# Patient Record
Sex: Male | Born: 1950 | Race: White | Hispanic: No | Marital: Married | Smoking: Never smoker
Health system: Southern US, Community
[De-identification: ages and names within clinical notes are randomized; demographics above are authoritative.]

## PROBLEM LIST (undated history)

## (undated) DIAGNOSIS — Z8719 Personal history of other diseases of the digestive system: Secondary | ICD-10-CM

## (undated) DIAGNOSIS — I493 Ventricular premature depolarization: Secondary | ICD-10-CM

## (undated) DIAGNOSIS — I10 Essential (primary) hypertension: Secondary | ICD-10-CM

## (undated) DIAGNOSIS — I609 Nontraumatic subarachnoid hemorrhage, unspecified: Secondary | ICD-10-CM

## (undated) DIAGNOSIS — E669 Obesity, unspecified: Secondary | ICD-10-CM

## (undated) DIAGNOSIS — G4730 Sleep apnea, unspecified: Secondary | ICD-10-CM

## (undated) DIAGNOSIS — J189 Pneumonia, unspecified organism: Secondary | ICD-10-CM

## (undated) DIAGNOSIS — E785 Hyperlipidemia, unspecified: Secondary | ICD-10-CM

## (undated) HISTORY — PX: KNEE ARTHROSCOPY: SUR90

## (undated) HISTORY — PX: COLONOSCOPY: SHX174

## (undated) HISTORY — PX: TONSILLECTOMY: SUR1361

## (undated) HISTORY — PX: VASECTOMY: SHX75

## (undated) HISTORY — PX: UPPER GASTROINTESTINAL ENDOSCOPY: SHX188

---

## 2007-07-27 ENCOUNTER — Ambulatory Visit: Payer: Self-pay | Admitting: Cardiology

## 2007-08-02 ENCOUNTER — Encounter: Payer: Self-pay | Admitting: Cardiology

## 2007-08-02 ENCOUNTER — Ambulatory Visit: Payer: Self-pay

## 2011-03-22 NOTE — Assessment & Plan Note (Signed)
[facility]                            CARDIOLOGY OFFICE NOTE   Brad Velez, Brad Velez                        MRN:          [id number]  DATE:07/27/2007                            DOB:          [birth year]    PRIMARY CARE PHYSICIAN:  None.   REASON FOR VISIT:  Evaluate patient with nonsustained ventricular  tachycardia.   HISTORY OF PRESENT ILLNESS:  The patient is a pleasant 59 year old  gentleman who is moving from [place].  He noted when taking his pulse  this summer that he would have some skipped beats.  He presented to  his primary care doctor who applied a Holter monitor.  He had  ventricular ectopy about 10%.  He had one four-beat run of nonsustained  ventricular tachycardia.  He had multiple couplets.  He does not feel  these.  He has never had any presyncope or syncope.  He was referred to  a cardiologist.  The plan was for a stress perfusion study, but he moved  here before that could be done.  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  He does do some walk/running in the field with  his kids.  With that level of activity and with his usual daily  activities, he denies any chest discomfort, neck or arm discomfort.  He  does not have any shortness of breath.  He does not have any PND or  orthopnea.  He says he does fatigue easier than he used to.  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  He will occasionally  now get on a treadmill, or an elliptical.   PAST MEDICAL HISTORY:  Hypertension x4 years, hyperlipidemia x4 years,  sleep apnea (he could not tolerate CPAP).   PAST SURGICAL HISTORY:  Right total knee replacement, tonsillectomy.   ALLERGIES:  No known drug allergies.   MEDICATIONS:  1. Aspirin 81 mg daily.  2. Lisinopril 10 mg daily.  3. Hydrochlorothiazide 25 mg daily.  4. Simvastatin 20 mg q.h.s.   SOCIAL HISTORY:  The patient is a Scientist, research (physical sciences).  He is married.  He has  four grown  children and three grandchildren.  He has never really smoked  cigarettes, so he chews tobacco.  He drinks rarely.   FAMILY HISTORY:  Noncontributory for early coronary artery disease.  He  does have a sister with some kind of a valve replacement.  His father  died at 74 of cancer.  His mother died at 42 of emphysema.   REVIEW OF SYSTEMS:  As stated in the HPI and negative for other systems.   PHYSICAL EXAMINATION:  GENERAL:  The patient is in no distress.  VITAL SIGNS:  Weight 289 pounds, blood pressure 138/86.  HEENT:  Eyes unremarkable.  Pupils equal, round, and reactive to light.  Fundi within normal limits.  Oral mucosa unremarkable.  NECK:  No jugular venous distention to 45 degrees, carotid upstroke  brisk and symmetric, no bruits and no thyromegaly.  LYMPHATICS:  No cervical, axillary, or inguinal adenopathy.  LUNGS:  Clear to auscultation bilaterally.  BACK:  No costovertebral angle tenderness.  CHEST:  Unremarkable.  HEART:  PMI not displaced or sustained.  S1 and S2 within normal limits.  No S3, no S4, no clicks, no murmurs, no rubs.  ABDOMEN:  Obese, positive bowel sounds normal in frequency and pitch.  No bruits, no rebound, no guarding, no midline pulsatile mass, no  hepatomegaly, and no splenomegaly.  SKIN:  No rashes and no nodules.  EXTREMITIES:  2+ pulses, no cyanosis, clubbing, or edema.  NEUROLOGY:  Oriented to person, place, and time.  Cranial nerves II-XII  grossly intact.  Motor grossly intact.   EKG; sinus bradycardia, rate 57, left axis deviation, poor anterior R  wave progression, intervals within normal limits.  No acute ST T wave  changes.   ASSESSMENT:  1. Nonsustained ventricular tachycardia.  The patient does not have      any symptoms related to this.  At this point, I am going to need to      make sure he has a structurally normal heart.  He will need a      stress perfusion study.  He will also need an echocardiogram.  I am      going to check  with his previous doctor to see if I can get the      full disclosure on his Holter.  I am also going to check to see if      he has labs.  If note, I am going to draw a TSH, BMET, and      magnesium.  2. Obesity.  We talked about the need to lose weight and I prescribed      the [facility] Diet.  He will be cleared for exercise after this      workup.  3. Hypertension.  His blood pressure is well controlled and he will      continue on the medications as listed.  4. Dyslipidemia.  He is going to look for a new primary care doctor.      I will defer management for now.  5. Sleep apnea.  I have encouraged him to see one of our sleep doctors      and we will give him a name.  He wants to hold off on making any      kind of a contact until his insurance situation is straightened      out.   FOLLOWUP:  I will see him back in about three months or sooner as  needed.     Rollene Rotunda, MD, [facility]  Electronically Signed    JH/MedQ  DD: 07/27/2007  DT: 07/27/2007  Job #: [id number]

## 2012-04-17 ENCOUNTER — Other Ambulatory Visit: Payer: Self-pay | Admitting: Orthopedic Surgery

## 2012-04-18 ENCOUNTER — Encounter (HOSPITAL_COMMUNITY): Payer: Self-pay | Admitting: Respiratory Therapy

## 2012-04-18 ENCOUNTER — Encounter (HOSPITAL_COMMUNITY): Payer: Self-pay | Admitting: *Deleted

## 2012-04-18 NOTE — Consult Note (Signed)
Anesthesia Chart Review:  Patient is a 61 year old male scheduled for a right TKR by Dr. Sherlean Foot on 04/30/12.  His PAT lab appointment is not until 04/23/12.   History includes HTN (but no longer requiring medication), HLD, PNA, hiatal hernia, arthritis, OSA, non-smoker.  PCP is Dr. Derrell Lolling with [facility] who cleared him for this procedure.  His EKG there on 03/20/12 showed SR with occasional PVCs, LAD, incomplete right BBB, poor R wave progression, RVH.  He moved from [place] to [place] around 2008.  Just prior to his move, he had a Holter test due to an irregular pulse (that he noticed when checking his pulse prior to exercising).  He was seen by Cardiologist Dr. Antoine Poche on 07/27/07.  By notes, he was told that he had ventricular ectopy ~ 10% and one four beat run of VT.  Patient was asymptomatic.  Stress and echo were recommended.  The patient reports he remembers having a treadmill stress test, but [facility] did not see any stress test results available.  He did have an echo done on 08/02/07 that showed: - Overall left ventricular systolic function was normal. Left ventricular ejection fraction was estimated to be 60 %. There was no diagnostic evidence of left ventricular regional wall motion abnormalities. - The left atrium was moderately dilated. - The right ventricle was mildly dilated. Right ventricular systolic function was mildly reduced. - The right atrium was moderately dilated. - Trivial MR/TR, mild PR.  Since then he has only been followed by his PCP and reports periodic EKGs that Dr. Derrell Lolling has not felt warranted additional Cardiology evaluation. He denies CP, SOB, palpitations, syncope.  He has not noticed any frequent or persistent irregularities in his heart rate.  He was using his elliptical for 45 minutes/day on a regular basis until a few months ago when his knee pain worsened.    Reviewed cardiac history with Anesthesiologist Dr. Michelle Piper who agrees that if there is no significant  change in his status and PAT "lab" results are reasonable, then plan to proceed.    Shonna Chock, PA-C

## 2012-04-18 NOTE — Progress Notes (Signed)
Spoke with Shonna Chock PA about patient hx of "irregular heart rate" and he went to [facility] cardiology. ECHO in EPIC and office note. Patient stated he did not go back to cardiology office. MD note mentions seeing patient back in 3 months but patient was never seen again. Hx HTN but has not been on any meds in 5 yrs per patient.   Called Dr. Derrell Lolling office in [place] and requested EKG-patient states he has had one done in last year.   Revonda Standard is aware of when patient is coming in for lab appt. Will put note on chart to see if Revonda Standard needs to see patient that day or not.

## 2012-04-23 ENCOUNTER — Encounter (HOSPITAL_COMMUNITY)
Admission: RE | Admit: 2012-04-23 | Discharge: 2012-04-23 | Disposition: A | Discharge status: Home / Self Care | Payer: BC Managed Care – PPO | Source: Ambulatory Visit | Attending: Orthopedic Surgery | Admitting: Orthopedic Surgery

## 2012-04-23 LAB — COMPREHENSIVE METABOLIC PANEL
ALT: 34 U/L (ref 0–53)
Albumin: 3.7 g/dL (ref 3.5–5.2)
Alkaline Phosphatase: 44 U/L (ref 39–117)
BUN: 18 mg/dL (ref 6–23)
Chloride: 103 mEq/L (ref 96–112)
Glucose, Bld: 91 mg/dL (ref 70–99)
Potassium: 4.6 mEq/L (ref 3.5–5.1)
Sodium: 139 mEq/L (ref 135–145)
Total Bilirubin: 0.4 mg/dL (ref 0.3–1.2)
Total Protein: 7.4 g/dL (ref 6.0–8.3)

## 2012-04-23 LAB — URINALYSIS, ROUTINE W REFLEX MICROSCOPIC
Bilirubin Urine: NEGATIVE
Glucose, UA: NEGATIVE mg/dL
Hgb urine dipstick: NEGATIVE
Ketones, ur: NEGATIVE mg/dL
Specific Gravity, Urine: 1.025 (ref 1.005–1.030)
pH: 5 (ref 5.0–8.0)

## 2012-04-23 LAB — TYPE AND SCREEN: Antibody Screen: NEGATIVE

## 2012-04-23 LAB — DIFFERENTIAL
Basophils Relative: 1 % (ref 0–1)
Eosinophils Absolute: 0.3 10*3/uL (ref 0.0–0.7)
Eosinophils Relative: 7 % — ABNORMAL HIGH (ref 0–5)
Lymphs Abs: 1.4 10*3/uL (ref 0.7–4.0)
Monocytes Relative: 9 % (ref 3–12)
Neutrophils Relative %: 55 % (ref 43–77)

## 2012-04-23 LAB — CBC
HCT: 48.9 % (ref 39.0–52.0)
Hemoglobin: 16.4 g/dL (ref 13.0–17.0)
MCHC: 33.5 g/dL (ref 30.0–36.0)
RDW: 13.3 % (ref 11.5–15.5)
WBC: 4.9 10*3/uL (ref 4.0–10.5)

## 2012-04-23 LAB — PROTIME-INR: INR: 0.95 (ref 0.00–1.49)

## 2012-04-23 LAB — SURGICAL PCR SCREEN: MRSA, PCR: NEGATIVE

## 2012-04-23 LAB — APTT: aPTT: 35 seconds (ref 24–37)

## 2012-04-23 NOTE — Pre-Procedure Instructions (Signed)
20 Brad Velez  04/23/2012   Your procedure is scheduled on:  Mon, June 24 @ 7:30 AM  Report to [facility] at 5:30 AM.  Call this number if you have problems the morning of surgery: [phone]   Remember:   Do not eat food:After Midnight.    Take these medicines the morning of surgery with A SIP OF WATER:    Do not wear jewelry  Do not wear lotions, powders, or cologne  Men may shave face and neck.  Do not bring valuables to the hospital.  Contacts, dentures or bridgework may not be worn into surgery.  Leave suitcase in the car. After surgery it may be brought to your room.  For patients admitted to the hospital, checkout time is 11:00 AM the day of discharge.   Patients discharged the day of surgery will not be allowed to drive home.  Special Instructions: Incentive Spirometry - Practice and bring it with you on the day of surgery. and CHG Shower Use Special Wash: 1/2 bottle night before surgery and 1/2 bottle morning of surgery.   Please read over the following fact sheets that you were given: Pain Booklet, Coughing and Deep Breathing, Blood Transfusion Information, Total Joint Packet, MRSA Information and Surgical Site Infection Prevention

## 2012-04-24 LAB — URINE CULTURE
Colony Count: NO GROWTH
Culture  Setup Time: 201306170817
Culture: NO GROWTH

## 2012-04-29 MED ORDER — CEFAZOLIN SODIUM-DEXTROSE 2-3 GM-% IV SOLR
2.0000 g | INTRAVENOUS | Status: AC
  Administered 2012-04-30: 2 g via INTRAVENOUS
  Filled 2012-04-29: qty 50

## 2012-04-29 MED ORDER — CHLORHEXIDINE GLUCONATE 4 % EX LIQD: 60.0000 mL | Freq: Once | CUTANEOUS | Status: DC

## 2012-04-29 NOTE — H&P (Signed)
  Brad Velez MRN:  [id number] DOB/SEX:  03-30-1951/male  CHIEF COMPLAINT:  Painful right Knee  HISTORY: Patient is a 61 y.o. male presented with a history of pain in the right knee. Onset of symptoms was gradual starting several years ago with gradually worsening course since that time. The patient noted no past surgery on the right knee. Prior procedures on the knee include arthroscopy. Patient has been treated conservatively with over-the-counter NSAIDs and activity modification. Patient currently rates pain in the knee at 9 out of 10 with activity. There is pain at night.  PAST MEDICAL HISTORY: There are no active problems to display for this patient.  Past Medical History  Diagnosis Date  . Hypertension     hx of but off of medication for 5 yrs  . Pneumonia     1- 2 yrs ago  . Sleep apnea     doesnt use CPAP- tested 15 yrs ago  . H/O hiatal hernia   . Arthritis    Past Surgical History  Procedure Date  . Knee arthroscopy     right  . Tonsillectomy      MEDICATIONS:   No prescriptions prior to admission    ALLERGIES:  No Known Allergies  REVIEW OF SYSTEMS:  Pertinent items are noted in HPI.   FAMILY HISTORY:  No family history on file.  SOCIAL HISTORY:   History  Substance Use Topics  . Smoking status: Never Smoker   . Smokeless tobacco: Former Neurosurgeon    Quit date: 11/18/2008  . Alcohol Use: Yes     occasional     EXAMINATION:  Vital signs in last 24 hours:    General appearance: alert, cooperative and no distress Lungs: clear to auscultation bilaterally Heart: regular rate and rhythm, S1, S2 normal, no murmur, click, rub or gallop Abdomen: soft, non-tender; bowel sounds normal; no masses,  no organomegaly Extremities: extremities normal, atraumatic, no cyanosis or edema and Homans sign is negative, no sign of DVT Pulses: 2+ and symmetric Skin: Skin color, texture, turgor normal. No rashes or lesions Neurologic: Alert and oriented X 3, normal  strength and tone. Normal symmetric reflexes. Normal coordination and gait  Musculoskeletal:  ROM 0-120, Ligaments intact,  Imaging Review Plain radiographs demonstrate severe degenerative joint disease of the right knee. The overall alignment is mild varus. The bone quality appears to be good for age and reported activity level.  Assessment/Plan: End stage arthritis, right knee   The patient history, physical examination and imaging studies are consistent with advanced degenerative joint disease of the right knee. The patient has failed conservative treatment.  The clearance notes were reviewed.  After discussion with the patient it was felt that Total Knee Replacement was indicated. The procedure,  risks, and benefits of total knee arthroplasty were presented and reviewed. The risks including but not limited to aseptic loosening, infection, blood clots, vascular injury, stiffness, patella tracking problems complications among others were discussed. The patient acknowledged the explanation, agreed to proceed with the plan.  Brad Velez 04/29/2012, 6:54 PM

## 2012-04-30 ENCOUNTER — Encounter (HOSPITAL_COMMUNITY)
Admission: RE | Disposition: A | Discharge status: Home Health | Payer: Self-pay | Source: Ambulatory Visit | Attending: Orthopedic Surgery

## 2012-04-30 ENCOUNTER — Encounter (HOSPITAL_COMMUNITY): Payer: Self-pay | Admitting: Vascular Surgery

## 2012-04-30 ENCOUNTER — Encounter (HOSPITAL_COMMUNITY): Payer: Self-pay | Admitting: *Deleted

## 2012-04-30 ENCOUNTER — Ambulatory Visit (HOSPITAL_COMMUNITY): Payer: BC Managed Care – PPO | Admitting: Vascular Surgery

## 2012-04-30 ENCOUNTER — Inpatient Hospital Stay (HOSPITAL_COMMUNITY)
Admission: RE | Admit: 2012-04-30 | Discharge: 2012-05-02 | DRG: 209 | Disposition: A | Discharge status: Home Health | Payer: BC Managed Care – PPO | Source: Ambulatory Visit | Attending: Orthopedic Surgery | Admitting: Orthopedic Surgery

## 2012-04-30 DIAGNOSIS — D62 Acute posthemorrhagic anemia: Secondary | ICD-10-CM | POA: Diagnosis not present

## 2012-04-30 DIAGNOSIS — M1711 Unilateral primary osteoarthritis, right knee: Secondary | ICD-10-CM

## 2012-04-30 DIAGNOSIS — G4730 Sleep apnea, unspecified: Secondary | ICD-10-CM | POA: Diagnosis present

## 2012-04-30 DIAGNOSIS — M1710 Unilateral primary osteoarthritis, unspecified knee: Principal | ICD-10-CM | POA: Diagnosis present

## 2012-04-30 DIAGNOSIS — Z7901 Long term (current) use of anticoagulants: Secondary | ICD-10-CM

## 2012-04-30 DIAGNOSIS — Z79899 Other long term (current) drug therapy: Secondary | ICD-10-CM

## 2012-04-30 DIAGNOSIS — I10 Essential (primary) hypertension: Secondary | ICD-10-CM | POA: Diagnosis present

## 2012-04-30 DIAGNOSIS — Z87891 Personal history of nicotine dependence: Secondary | ICD-10-CM

## 2012-04-30 HISTORY — DX: Sleep apnea, unspecified: G47.30

## 2012-04-30 HISTORY — DX: Personal history of other diseases of the digestive system: Z87.19

## 2012-04-30 HISTORY — DX: Pneumonia, unspecified organism: J18.9

## 2012-04-30 HISTORY — DX: Essential (primary) hypertension: I10

## 2012-04-30 HISTORY — PX: TOTAL KNEE ARTHROPLASTY: SHX125

## 2012-04-30 SURGERY — ARTHROPLASTY, KNEE, TOTAL
Anesthesia: Regional | Site: Knee | Laterality: Right | Wound class: Clean

## 2012-04-30 MED ORDER — MORPHINE SULFATE 10 MG/ML IJ SOLN
INTRAMUSCULAR | Status: DC | PRN
  Administered 2012-04-30: 6 mg via INTRAVENOUS
  Administered 2012-04-30: 4 mg via INTRAVENOUS

## 2012-04-30 MED ORDER — ONDANSETRON HCL 4 MG PO TABS: 4.0000 mg | ORAL_TABLET | Freq: Four times a day (QID) | ORAL | Status: DC | PRN

## 2012-04-30 MED ORDER — SODIUM CHLORIDE 0.9 % IV SOLN
INTRAVENOUS | Status: DC
  Administered 2012-04-30 (×2): via INTRAVENOUS

## 2012-04-30 MED ORDER — FENTANYL CITRATE 0.05 MG/ML IJ SOLN
INTRAMUSCULAR | Status: DC | PRN
  Administered 2012-04-30: 50 ug via INTRAVENOUS
  Administered 2012-04-30 (×4): 100 ug via INTRAVENOUS
  Administered 2012-04-30: 50 ug via INTRAVENOUS

## 2012-04-30 MED ORDER — ACETAMINOPHEN 650 MG RE SUPP: 650.0000 mg | Freq: Four times a day (QID) | RECTAL | Status: DC | PRN

## 2012-04-30 MED ORDER — MENTHOL 3 MG MT LOZG: 1.0000 | LOZENGE | OROMUCOSAL | Status: DC | PRN

## 2012-04-30 MED ORDER — SODIUM CHLORIDE 0.9 % IV SOLN: INTRAVENOUS | Status: DC

## 2012-04-30 MED ORDER — BUPIVACAINE 0.25 % ON-Q PUMP SINGLE CATH 300ML
INJECTION | Status: DC | PRN
  Administered 2012-04-30: 300 mL

## 2012-04-30 MED ORDER — METOCLOPRAMIDE HCL 5 MG/ML IJ SOLN: 5.0000 mg | Freq: Three times a day (TID) | INTRAMUSCULAR | Status: DC | PRN

## 2012-04-30 MED ORDER — METOCLOPRAMIDE HCL 10 MG PO TABS: 5.0000 mg | ORAL_TABLET | Freq: Three times a day (TID) | ORAL | Status: DC | PRN

## 2012-04-30 MED ORDER — ACETAMINOPHEN 10 MG/ML IV SOLN
1000.0000 mg | Freq: Four times a day (QID) | INTRAVENOUS | Status: AC
  Administered 2012-04-30 – 2012-05-01 (×4): 1000 mg via INTRAVENOUS
  Filled 2012-04-30 (×4): qty 100

## 2012-04-30 MED ORDER — LACTATED RINGERS IV SOLN
INTRAVENOUS | Status: DC | PRN
  Administered 2012-04-30 (×2): via INTRAVENOUS

## 2012-04-30 MED ORDER — MELOXICAM 15 MG PO TABS
15.0000 mg | ORAL_TABLET | Freq: Every day | ORAL | Status: DC
Start: 2012-04-30 — End: 2012-05-02
  Administered 2012-04-30 – 2012-05-02 (×3): 15 mg via ORAL
  Filled 2012-04-30 (×3): qty 1

## 2012-04-30 MED ORDER — ENOXAPARIN SODIUM 30 MG/0.3ML ~~LOC~~ SOLN
30.0000 mg | Freq: Two times a day (BID) | SUBCUTANEOUS | Status: DC
  Administered 2012-04-30 – 2012-05-02 (×4): 30 mg via SUBCUTANEOUS
  Filled 2012-04-30 (×6): qty 0.3

## 2012-04-30 MED ORDER — HYDROMORPHONE HCL PF 1 MG/ML IJ SOLN
0.2500 mg | INTRAMUSCULAR | Status: DC | PRN
  Administered 2012-04-30 (×2): 0.5 mg via INTRAVENOUS

## 2012-04-30 MED ORDER — CEFAZOLIN SODIUM-DEXTROSE 2-3 GM-% IV SOLR
2.0000 g | Freq: Four times a day (QID) | INTRAVENOUS | Status: AC
  Administered 2012-04-30 (×2): 2 g via INTRAVENOUS
  Filled 2012-04-30 (×3): qty 50

## 2012-04-30 MED ORDER — BUPIVACAINE-EPINEPHRINE PF 0.5-1:200000 % IJ SOLN
INTRAMUSCULAR | Status: DC | PRN
  Administered 2012-04-30: 30 mL

## 2012-04-30 MED ORDER — PROPOFOL 10 MG/ML IV EMUL
INTRAVENOUS | Status: DC | PRN
  Administered 2012-04-30: 200 mg via INTRAVENOUS

## 2012-04-30 MED ORDER — OXYCODONE HCL 5 MG PO TABS
5.0000 mg | ORAL_TABLET | ORAL | Status: DC | PRN
  Administered 2012-04-30 – 2012-05-02 (×3): 10 mg via ORAL
  Filled 2012-04-30 (×3): qty 2

## 2012-04-30 MED ORDER — ATORVASTATIN CALCIUM 20 MG PO TABS
20.0000 mg | ORAL_TABLET | Freq: Every day | ORAL | Status: DC
  Administered 2012-04-30: 20 mg via ORAL
  Filled 2012-04-30 (×2): qty 1

## 2012-04-30 MED ORDER — BUPIVACAINE HCL (PF) 0.25 % IJ SOLN
INTRAMUSCULAR | Status: AC
  Filled 2012-04-30: qty 30

## 2012-04-30 MED ORDER — VECURONIUM BROMIDE 10 MG IV SOLR
INTRAVENOUS | Status: DC | PRN
  Administered 2012-04-30: 6 mg via INTRAVENOUS

## 2012-04-30 MED ORDER — ONDANSETRON HCL 4 MG/2ML IJ SOLN
INTRAMUSCULAR | Status: DC | PRN
  Administered 2012-04-30: 4 mg via INTRAVENOUS

## 2012-04-30 MED ORDER — ACETAMINOPHEN 325 MG PO TABS
650.0000 mg | ORAL_TABLET | Freq: Four times a day (QID) | ORAL | Status: DC | PRN
  Administered 2012-05-02: 650 mg via ORAL
  Filled 2012-04-30: qty 2

## 2012-04-30 MED ORDER — OXYCODONE HCL 10 MG PO TB12
10.0000 mg | ORAL_TABLET | Freq: Two times a day (BID) | ORAL | Status: DC
  Administered 2012-04-30 – 2012-05-02 (×4): 10 mg via ORAL
  Filled 2012-04-30 (×4): qty 1

## 2012-04-30 MED ORDER — ONDANSETRON HCL 4 MG/2ML IJ SOLN: 4.0000 mg | Freq: Four times a day (QID) | INTRAMUSCULAR | Status: DC | PRN

## 2012-04-30 MED ORDER — NEOSTIGMINE METHYLSULFATE 1 MG/ML IJ SOLN
INTRAMUSCULAR | Status: DC | PRN
  Administered 2012-04-30: 3 mg via INTRAVENOUS

## 2012-04-30 MED ORDER — HYDROMORPHONE HCL PF 1 MG/ML IJ SOLN
1.0000 mg | INTRAMUSCULAR | Status: DC | PRN
  Administered 2012-04-30 – 2012-05-02 (×10): 1 mg via INTRAVENOUS
  Filled 2012-04-30 (×10): qty 1

## 2012-04-30 MED ORDER — SODIUM CHLORIDE 0.9 % IR SOLN
Status: DC | PRN
  Administered 2012-04-30: 1000 mL
  Administered 2012-04-30: 3000 mL

## 2012-04-30 MED ORDER — ACETAMINOPHEN 10 MG/ML IV SOLN
1000.0000 mg | Freq: Four times a day (QID) | INTRAVENOUS | Status: DC
  Administered 2012-04-30: 1000 mg via INTRAVENOUS

## 2012-04-30 MED ORDER — GLYCOPYRROLATE 0.2 MG/ML IJ SOLN
INTRAMUSCULAR | Status: DC | PRN
  Administered 2012-04-30: .6 mg via INTRAVENOUS

## 2012-04-30 MED ORDER — LIDOCAINE HCL (CARDIAC) 20 MG/ML IV SOLN
INTRAVENOUS | Status: DC | PRN
  Administered 2012-04-30: 75 mg via INTRAVENOUS

## 2012-04-30 MED ORDER — BUPIVACAINE-EPINEPHRINE 0.25% -1:200000 IJ SOLN
INTRAMUSCULAR | Status: DC | PRN
  Administered 2012-04-30: 22 mL

## 2012-04-30 MED ORDER — OXYCODONE HCL 5 MG PO TABS
ORAL_TABLET | ORAL | Status: AC
  Filled 2012-04-30: qty 2

## 2012-04-30 MED ORDER — PHENOL 1.4 % MT LIQD: 1.0000 | OROMUCOSAL | Status: DC | PRN

## 2012-04-30 MED ORDER — ALUM & MAG HYDROXIDE-SIMETH 200-200-20 MG/5ML PO SUSP: 30.0000 mL | ORAL | Status: DC | PRN

## 2012-04-30 MED ORDER — HYDROMORPHONE HCL PF 1 MG/ML IJ SOLN
INTRAMUSCULAR | Status: AC
  Administered 2012-05-01: 1 mg via INTRAVENOUS
  Filled 2012-04-30: qty 1

## 2012-04-30 MED ORDER — BUPIVACAINE 0.25 % ON-Q PUMP SINGLE CATH 300ML
300.0000 mL | INJECTION | Status: DC
  Filled 2012-04-30: qty 300

## 2012-04-30 SURGICAL SUPPLY — 56 items
BANDAGE ESMARK 6X9 LF (GAUZE/BANDAGES/DRESSINGS) ×1 IMPLANT
BLADE SAGITTAL 13X1.27X60 (BLADE) ×2 IMPLANT
BLADE SAW SGTL 83.5X18.5 (BLADE) ×2 IMPLANT
BNDG ESMARK 6X9 LF (GAUZE/BANDAGES/DRESSINGS) ×2
BOWL SMART MIX CTS (DISPOSABLE) ×2 IMPLANT
CATH KIT ON Q 5IN SLV (PAIN MANAGEMENT) ×2 IMPLANT
CEMENT BONE SIMPLEX SPEEDSET (Cement) ×4 IMPLANT
CLOTH BEACON ORANGE TIMEOUT ST (SAFETY) ×2 IMPLANT
COVER BACK TABLE 24X17X13 BIG (DRAPES) IMPLANT
COVER SURGICAL LIGHT HANDLE (MISCELLANEOUS) ×2 IMPLANT
CUFF TOURNIQUET SINGLE 34IN LL (TOURNIQUET CUFF) ×2 IMPLANT
DRAPE EXTREMITY T 121X128X90 (DRAPE) ×2 IMPLANT
DRAPE INCISE IOBAN 66X45 STRL (DRAPES) ×4 IMPLANT
DRAPE PROXIMA HALF (DRAPES) ×2 IMPLANT
DRAPE U-SHAPE 47X51 STRL (DRAPES) ×2 IMPLANT
DRSG ADAPTIC 3X8 NADH LF (GAUZE/BANDAGES/DRESSINGS) ×2 IMPLANT
DRSG PAD ABDOMINAL 8X10 ST (GAUZE/BANDAGES/DRESSINGS) ×2 IMPLANT
DURAPREP 26ML APPLICATOR (WOUND CARE) ×4 IMPLANT
ELECT REM PT RETURN 9FT ADLT (ELECTROSURGICAL) ×2
ELECTRODE REM PT RTRN 9FT ADLT (ELECTROSURGICAL) ×1 IMPLANT
EVACUATOR 1/8 PVC DRAIN (DRAIN) ×2 IMPLANT
GLOVE BIOGEL M 7.0 STRL (GLOVE) IMPLANT
GLOVE BIOGEL PI IND STRL 7.5 (GLOVE) IMPLANT
GLOVE BIOGEL PI IND STRL 8.5 (GLOVE) ×2 IMPLANT
GLOVE BIOGEL PI INDICATOR 7.5 (GLOVE)
GLOVE BIOGEL PI INDICATOR 8.5 (GLOVE) ×2
GLOVE SURG ORTHO 8.0 STRL STRW (GLOVE) ×4 IMPLANT
GOWN PREVENTION PLUS XLARGE (GOWN DISPOSABLE) ×4 IMPLANT
GOWN STRL NON-REIN LRG LVL3 (GOWN DISPOSABLE) ×2 IMPLANT
HANDPIECE INTERPULSE COAX TIP (DISPOSABLE) ×1
HOOD PEEL AWAY FACE SHEILD DIS (HOOD) ×8 IMPLANT
KIT BASIN OR (CUSTOM PROCEDURE TRAY) ×2 IMPLANT
KIT ROOM TURNOVER OR (KITS) ×2 IMPLANT
MANIFOLD NEPTUNE II (INSTRUMENTS) ×2 IMPLANT
NEEDLE 22X1 1/2 (OR ONLY) (NEEDLE) ×2 IMPLANT
NS IRRIG 1000ML POUR BTL (IV SOLUTION) ×2 IMPLANT
PACK TOTAL JOINT (CUSTOM PROCEDURE TRAY) ×2 IMPLANT
PAD ARMBOARD 7.5X6 YLW CONV (MISCELLANEOUS) ×4 IMPLANT
PADDING CAST COTTON 6X4 STRL (CAST SUPPLIES) ×2 IMPLANT
POSITIONER HEAD PRONE TRACH (MISCELLANEOUS) ×2 IMPLANT
SET HNDPC FAN SPRY TIP SCT (DISPOSABLE) ×1 IMPLANT
SPONGE GAUZE 4X4 12PLY (GAUZE/BANDAGES/DRESSINGS) ×2 IMPLANT
STAPLER VISISTAT 35W (STAPLE) ×2 IMPLANT
SUCTION FRAZIER TIP 10 FR DISP (SUCTIONS) ×2 IMPLANT
SUT BONE WAX W31G (SUTURE) ×2 IMPLANT
SUT VIC AB 0 CTB1 27 (SUTURE) ×4 IMPLANT
SUT VIC AB 1 CT1 27 (SUTURE) ×1
SUT VIC AB 1 CT1 27XBRD ANBCTR (SUTURE) ×1 IMPLANT
SUT VIC AB 2-0 CT1 27 (SUTURE) ×2
SUT VIC AB 2-0 CT1 TAPERPNT 27 (SUTURE) ×2 IMPLANT
SUT VLOC 180 0 24IN GS25 (SUTURE) ×2 IMPLANT
SYR CONTROL 10ML LL (SYRINGE) ×2 IMPLANT
TOWEL OR 17X24 6PK STRL BLUE (TOWEL DISPOSABLE) ×2 IMPLANT
TOWEL OR 17X26 10 PK STRL BLUE (TOWEL DISPOSABLE) ×2 IMPLANT
TRAY FOLEY CATH 14FR (SET/KITS/TRAYS/PACK) ×2 IMPLANT
WATER STERILE IRR 1000ML POUR (IV SOLUTION) ×6 IMPLANT

## 2012-04-30 NOTE — Addendum Note (Signed)
Addendum  created 04/30/12 1143 by Carmela Rima, CRNA   Modules edited:Anesthesia Blocks and Procedures, Inpatient Notes

## 2012-04-30 NOTE — Anesthesia Procedure Notes (Addendum)
Anesthesia Regional Block:  Femoral nerve block  Pre-Anesthetic Checklist: ,, timeout performed, Correct Patient, Correct Site, Correct Laterality, Correct Procedure,, site marked, risks and benefits discussed, Surgical consent,  Pre-op evaluation,  At surgeon's request and post-op pain management  Laterality: Right  Prep: chloraprep       Needles:  Injection technique: Single-shot  Needle Type: Echogenic Stimulator Needle     Needle Length: 9cm  Needle Gauge: 21    Additional Needles:  Procedures: nerve stimulator Femoral nerve block  Nerve Stimulator or Paresthesia:  Response: Quadriceps muscle contraction, 0.45 mA,   Additional Responses:   Narrative:  Start time: 04/30/2012 7:01 AM End time: 04/30/2012 7:16 AM Injection made incrementally with aspirations every 5 mL.  Performed by: Personally  Anesthesiologist: Dr Chaney Malling  Additional Notes: Functioning IV was confirmed and monitors were applied.  A 90mm 21ga Arrow echogenic stimulator needle was used. Sterile prep and drape,hand hygiene and sterile gloves were used.  Negative aspiration and negative test dose prior to incremental administration of local anesthetic. The patient tolerated the procedure well.    Femoral nerve block Procedure Name: Intubation Date/Time: 04/30/2012 7:43 AM Performed by: Carmela Rima Pre-anesthesia Checklist: Patient identified, Timeout performed, Emergency Drugs available, Suction available and Patient being monitored Patient Re-evaluated:Patient Re-evaluated prior to inductionOxygen Delivery Method: Circle system utilized Preoxygenation: Pre-oxygenation with 100% oxygen Intubation Type: IV induction Ventilation: Mask ventilation without difficulty Laryngoscope Size: Mac and 3 Grade View: Grade I Tube type: Oral Tube size: 7.5 mm Number of attempts: 1 Placement Confirmation: ETT inserted through vocal cords under direct vision,  positive ETCO2 and breath sounds checked- equal  and bilateral Secured at: 23 cm Tube secured with: Tape Dental Injury: Teeth and Oropharynx as per pre-operative assessment

## 2012-04-30 NOTE — Anesthesia Postprocedure Evaluation (Signed)
Anesthesia Post Note  Patient: Brad Velez  Procedure(s) Performed: Procedure(s) (LRB): TOTAL KNEE ARTHROPLASTY (Right)  Anesthesia type: General  Patient location: PACU  Post pain: Pain level controlled and Adequate analgesia  Post assessment: Post-op Vital signs reviewed, Patient's Cardiovascular Status Stable, Respiratory Function Stable, Patent Airway and Pain level controlled  Last Vitals:  Filed Vitals:   04/30/12 1009  BP: 175/84  Pulse: 78  Temp: 36.7 C  Resp: 15    Post vital signs: Reviewed and stable  Level of consciousness: awake, alert  and oriented  Complications: No apparent anesthesia complications

## 2012-04-30 NOTE — Progress Notes (Signed)
Orthopedic Tech Progress Note Patient Details:  Brad Velez Mar 05, 1951 [id number]  CPM Right Knee CPM Right Knee: On Right Knee Flexion (Degrees): 90  Right Knee Extension (Degrees): 0  Additional Comments: trapeze bar   Cammer, Mickie Bail 04/30/2012, 10:41 AM

## 2012-04-30 NOTE — Transfer of Care (Signed)
Immediate Anesthesia Transfer of Care Note  Patient: Brad Velez  Procedure(s) Performed: Procedure(s) (LRB): TOTAL KNEE ARTHROPLASTY (Right)  Patient Location: PACU  Anesthesia Type: General  Level of Consciousness: awake, alert  and oriented  Airway & Oxygen Therapy: Patient Spontanous Breathing and Patient connected to nasal cannula oxygen  Post-op Assessment: Report given to PACU RN, Post -op Vital signs reviewed and stable, Patient moving all extremities and Patient able to stick tongue midline  Post vital signs: Reviewed and stable  Complications: No apparent anesthesia complications

## 2012-04-30 NOTE — Anesthesia Preprocedure Evaluation (Addendum)
Anesthesia Evaluation  Patient identified by MRN, date of birth, ID band Patient awake    Reviewed: Allergy & Precautions, H&P , NPO status , Patient's Chart, lab work & pertinent test results  History of Anesthesia Complications (+) PROLONGED EMERGENCE  Airway Mallampati: II  Neck ROM: full    Dental  (+) Dental Advidsory Given   Pulmonary sleep apnea , pneumonia ,          Cardiovascular hypertension,     Neuro/Psych    GI/Hepatic hiatal hernia,   Endo/Other  obese  Renal/GU      Musculoskeletal  (+) Arthritis -,   Abdominal   Peds  Hematology   Anesthesia Other Findings   Reproductive/Obstetrics                          Anesthesia Physical Anesthesia Plan  ASA: II  Anesthesia Plan: General and Regional   Post-op Pain Management: MAC Combined w/ Regional for Post-op pain   Induction: Intravenous  Airway Management Planned: Oral ETT  Additional Equipment:   Intra-op Plan:   Post-operative Plan: Extubation in OR  Informed Consent: I have reviewed the patients History and Physical, chart, labs and discussed the procedure including the risks, benefits and alternatives for the proposed anesthesia with the patient or authorized representative who has indicated his/her understanding and acceptance.   Dental Advisory Given  Plan Discussed with: CRNA and Surgeon  Anesthesia Plan Comments:        Anesthesia Quick Evaluation

## 2012-04-30 NOTE — Preoperative (Signed)
Beta Blockers   Reason not to administer Beta Blockers:Not Applicable 

## 2012-04-30 NOTE — Addendum Note (Signed)
Addendum  created 04/30/12 1143 by Chosen Geske F Johnette Teigen, CRNA   Modules edited:Anesthesia Blocks and Procedures, Inpatient Notes    

## 2012-05-01 LAB — CBC
MCHC: 33.4 g/dL (ref 30.0–36.0)
MCV: 97.2 fL (ref 78.0–100.0)
Platelets: 207 10*3/uL (ref 150–400)
RDW: 13.5 % (ref 11.5–15.5)
WBC: 9.5 10*3/uL (ref 4.0–10.5)

## 2012-05-01 LAB — BASIC METABOLIC PANEL
Chloride: 96 mEq/L (ref 96–112)
Creatinine, Ser: 1.05 mg/dL (ref 0.50–1.35)
GFR calc Af Amer: 85 mL/min — ABNORMAL LOW (ref >90)
GFR calc non Af Amer: 74 mL/min — ABNORMAL LOW (ref >90)
Potassium: 4.7 mEq/L (ref 3.5–5.1)

## 2012-05-01 MED ORDER — ROSUVASTATIN CALCIUM 10 MG PO TABS
10.0000 mg | ORAL_TABLET | Freq: Every day | ORAL | Status: DC
  Administered 2012-05-01: 10 mg via ORAL
  Filled 2012-05-01 (×2): qty 1

## 2012-05-01 MED FILL — Acetaminophen IV Soln 10 MG/ML: INTRAVENOUS | Qty: 100 | Status: AC

## 2012-05-01 NOTE — Progress Notes (Signed)
PT Progress Note:     05/01/12 1300  PT Visit Information  Last PT Received On 05/01/12  Assistance Needed +1  PT Time Calculation  PT Start Time 1311  PT Stop Time 1346  PT Time Calculation (min) 35 min  Precautions  Precautions Knee  Restrictions  RLE Weight Bearing WBAT  Cognition  Overall Cognitive Status Appears within functional limits for tasks assessed/performed  Arousal/Alertness Awake/alert  Orientation Level Oriented X4 / Intact  Behavior During Session [facility] for tasks performed  Bed Mobility  Bed Mobility Supine to Sit;Sit to Supine  Supine to Sit 4: Min guard;HOB flat  Sit to Supine 4: Min guard;HOB flat  Details for Bed Mobility Assistance Cues for technique.  No physical (A) needed.    Transfers  Transfers Sit to Stand;Stand to Sit  Sit to Stand 4: Min guard;With upper extremity assist;From bed  Stand to Sit 4: Min guard;With upper extremity assist;To bed  Details for Transfer Assistance Reinforcement cues for safest hand placement  Ambulation/Gait  Ambulation/Gait Assistance 4: Min guard  Ambulation Distance (Feet) 100 Feet  Assistive device Rolling walker  Ambulation/Gait Assistance Details cues to roll RW rather than pick up to advance, cues for  tall posture, increased knee extension during stance phase.    Gait Pattern Step-to pattern;Decreased step length - right;Decreased step length - left;Decreased stance time - right  Stairs No  Wheelchair Mobility  Wheelchair Mobility No  Balance  Balance Assessed No  Exercises  Exercises Total Joint  Total Joint Exercises  Ankle Circles/Pumps AROM;Both;10 reps;Supine  Quad Sets Right;10 reps;Supine;AROM  Hip ABduction/ADduction AROM;Right;10 reps  Straight Leg Raises AAROM;Right;10 reps  PT - End of Session  Equipment Utilized During Treatment Gait belt  Activity Tolerance Patient tolerated treatment well  Patient left in bed;in CPM;with call bell/phone within reach  PT - Assessment/Plan  Comments on  Treatment Session Pt making progress with mobility this session.  Increased ambulation distance.  Very motivated.  On track for a safe d/c home when MD feels appropriate.  Will trial step to mimic entering/exiting house next PT session.  Pt placed back on CPM at end of session with increased range 0-90 degrees.  Tolerated increase well.    PT Plan Discharge plan remains appropriate  PT Frequency 7X/week  Follow Up Recommendations Home health PT;Supervision/Assistance - 24 hour  Equipment Recommended None recommended by OT  Acute Rehab PT Goals  Time For Goal Achievement 05/08/12  Potential to Achieve Goals Good  PT Goal: Supine/Side to Sit - Progress Progressing toward goal  PT Goal: Sit to Supine/Side - Progress Progressing toward goal  PT Goal: Sit to Stand - Progress Progressing toward goal  PT Goal: Ambulate - Progress Progressing toward goal  PT Goal: Perform Home Exercise Program - Progress Progressing toward goal  PT General Charges  $$ ACUTE PT VISIT 1 Procedure  PT Treatments  $Gait Training 8-22 mins  $Therapeutic Activity 8-22 mins     Pain:  "it's fine".  Pt did not rate.     Verdell Face, [place] [phone] 05/01/2012

## 2012-05-01 NOTE — Op Note (Signed)
TOTAL KNEE REPLACEMENT OPERATIVE NOTE:  04/30/2012  6:16 PM  PATIENT:  Brad Velez  61 y.o. male  PRE-OPERATIVE DIAGNOSIS:  osteoarthritis right knee  POST-OPERATIVE DIAGNOSIS:  osteoarthritis right knee  PROCEDURE:  Procedure(s): TOTAL KNEE ARTHROPLASTY  SURGEON:  Surgeon(s): Raymon Mutton, MD  PHYSICIAN ASSISTANT: Altamese Cabal, [facility]  ANESTHESIA:   general  DRAINS: Hemovac and On-Q Marcaine Pain Pump  SPECIMEN: None  COUNTS:  Correct  TOURNIQUET:   Total Tourniquet Time Documented: Thigh (Right) - 69 minutes  DICTATION:  Indication for procedure:    The patient is a 61 y.o. male who has failed conservative treatment for osteoarthritis right knee.  Informed consent was obtained prior to anesthesia. The risks versus benefits of the operation were explain and in a way the patient can, and did, understand.   Description of procedure:     The patient was taken to the operating room and placed under anesthesia.  The patient was positioned in the usual fashion taking care that all body parts were adequately padded and/or protected.  I foley catheter was placed.  A tourniquet was applied and the leg prepped and draped in the usual sterile fashion.  The extremity was exsanguinated with the esmarch and tourniquet inflated to 350 mmHg.  Pre-operative range of motion was normal.  The knee was in 5 degree of mild varus.  A midline incision approximately 6-7 inches long was made with a #10 blade.  A new blade was used to make a parapatellar arthrotomy going 2-3 cm into the quadriceps tendon, over the patella, and alongside the medial aspect of the patellar tendon.  A synovectomy was then performed with the #10 blade and forceps. I then elevated the deep MCL off the medial tibial metaphysis subperiosteally around to the semimembranosus attachment.    I everted the patella and used calipers to measure patellar thickness.  I used the reamer to ream down to appropriate thickness to  recreate the native thickness.  I then removed excess bone with the rongeur and sagittal saw.  I used the appropriately sized template and drilled the three lug holes.  I then put the trial in place and measured the thickness with the calipers to ensure recreation of the native thickness.  The trial was then removed and the patella subluxed and the knee brought into flexion.  A homan retractor was place to retract and protect the patella and lateral structures.  A Z-retractor was place medially to protect the medial structures.  The extra-medullary alignment system was used to make cut the tibial articular surface perpendicular to the anamotic axis of the tibia and in 3 degrees of posterior slope.  The cut surface and alignment jig was removed.  I then used the intramedullary alignment guide to make a 6 valgus cut on the distal femur.  I then marked out the epicondylar axis on the distal femur.  The posterior condylar axis measured 3 degrees.  I then used the anterior referencing sizer and measured the femur to be a size G.  The 4-In-1 cutting block was screwed into place in external rotation matching the posterior condylar angle, making our cuts perpendicular to the epicondylar axis.  Anterior, posterior and chamfer cuts were made with the sagittal saw.  The cutting block and cut pieces were removed.  A lamina spreader was placed in 90 degrees of flexion.  The ACL, PCL, menisci, and posterior condylar osteophytes were removed.  A 17 mm spacer blocked was found to offer good flexion  and extension gap balance after moderate in degree releasing.   The scoop retractor was then placed and the femoral finishing block was pinned in place.  The small sagittal saw was used as well as the lug drill to finish the femur.  The block and cut surfaces were removed and the medullary canal hole filled with autograft bone from the cut pieces.  The tibia was delivered forward in deep flexion and external rotation.  A size 8  tray was selected and pinned into place centered on the medial 1/3 of the tibial tubercle.  The reamer and keel was used to prepare the tibia through the tray.    I then trialed with the size G femur, size 8 tibia, a 17 mm insert and the 38 patella.  I had excellent flexion/extension gap balance, excellent patella tracking.  Flexion was full and beyond 120 degrees; extension was zero.  These components were chosen and the staff opened them to me on the back table while the knee was lavaged copiously and the cement mixed.  I cemented in the components and removed all excess cement.  The polyethylene tibial component was snapped into place and the knee placed in extension while cement was hardening.  The capsule was infilltrated with 20cc of .25% Marcaine with epinephrine.  A hemovac was place in the joint exiting superolaterally.  A pain pump was place superomedially superficial to the arthrotomy.  Once the cement was hard, the tourniquet was let down.  Hemostasis was obtained.  The arthrotomy was closed with figure-8 #1 vicryl sutures.  The deep soft tissues were closed with #0 vicryls and the subcuticular layer closed with a running #2-0 vicryl.  The skin was reapproximated and closed with skin staples.  The wound was dressed with xeroform, 4 x4's, 2 ABD sponges, a single layer of webril and a TED stocking.   The patient was then awakened, extubated, and taken to the recovery room in stable condition.  BLOOD LOSS:  300cc DRAINS: 1 hemovac, 1 pain catheter COMPLICATIONS:  None.  PLAN OF CARE: Admit to inpatient   PATIENT DISPOSITION:  PACU - hemodynamically stable.   Delay start of Pharmacological VTE agent (>24hrs) due to surgical blood loss or risk of bleeding:  not applicable  Please fax a copy of this op note to my office at [phone] (please only include page 1 and 2 of the Case Information op note)

## 2012-05-01 NOTE — Progress Notes (Signed)
Georgena Spurling, MD   Altamese Cabal, PA-C [address] [place], [place], [place]  [address]                             [phone]   PROGRESS NOTE  Subjective:  negative for Chest Pain  negative for Shortness of Breath  negative for Nausea/Vomiting   negative for Calf Pain  negative for Bowel Movement   Tolerating Diet: yes         Patient reports pain as 5 on 0-10 scale.    Objective: Vital signs in last 24 hours:   Patient Vitals for the past 24 hrs:  BP Temp Temp src Pulse Resp SpO2  05/01/12 0605 137/68 mmHg 98.6 F (37 C) - 68  18  92 %  05/01/12 0400 - - - - 16  96 %  05/01/12 0000 - - - - 18  95 %  04/30/12 2145 150/76 mmHg 98.8 F (37.1 C) - 72  19  94 %  04/30/12 2000 - - - - 18  95 %  04/30/12 1820 143/70 mmHg 98.5 F (36.9 C) Oral 68  19  93 %  04/30/12 1311 178/89 mmHg 97.5 F (36.4 C) Oral 68  17  93 %  04/30/12 1200 137/56 mmHg 98.8 F (37.1 C) Oral 85  18  93 %  04/30/12 1145 - - - 67  15  96 %  04/30/12 1130 173/87 mmHg 97.6 F (36.4 C) - 73  17  97 %  04/30/12 1115 169/82 mmHg - - 69  11  99 %  04/30/12 1100 179/78 mmHg - - 72  17  94 %  04/30/12 1045 181/84 mmHg - - 73  28  98 %  04/30/12 1030 170/76 mmHg - - 74  27  97 %  04/30/12 1015 - - - 76  11  93 %  04/30/12 1009 175/84 mmHg 98 F (36.7 C) - 78  15  96 %    @flow {1959:LAST@   Intake/Output from previous day:   06/24 0701 - 06/25 0700 In: 3787.5 [P.O.:480; I.V.:3107.5] Out: 3200 [Urine:1600; Drains:1400]   Intake/Output this shift:       Intake/Output      06/24 0701 - 06/25 0700 06/25 0701 - 06/26 0700   P.O. 480    I.V. 3107.5    IV Piggyback 200    Total Intake 3787.5    Urine 1600    Drains 1400    Blood 200    Total Output 3200    Net +587.5            LABORATORY DATA:  Basename 05/01/12 0505  WBC 9.5  HGB 12.6*  HCT 37.7*  PLT 207    Basename 05/01/12 0505  NA 132*  K 4.7  CL 96  CO2 29  BUN 15  CREATININE 1.05  GLUCOSE 114*  CALCIUM 8.7   Lab  Results  Component Value Date   INR 0.95 04/23/2012    Examination:  General appearance: alert, appears stated age and no distress Extremities: Homans sign is negative, no sign of DVT  Wound Exam: clean, dry, intact   Drainage:  None: wound tissue dry  Motor Exam: EHL and FHL Intact  Sensory Exam: Deep Peroneal normal  Vascular Exam:    Assessment:    1 Day Post-Op  Procedure(s) (LRB): TOTAL KNEE ARTHROPLASTY (Right)  ADDITIONAL DIAGNOSIS:  Active Problems:  * No active hospital  problems. *   Acute Blood Loss Anemia   Plan: Physical Therapy as ordered Weight Bearing as Tolerated (WBAT)  DVT Prophylaxis:  lovenox  DISCHARGE PLAN: Home  DISCHARGE NEEDS: HHPT, CPM, Walker and 3-in-1 comode seat         Kenlee Vogt 05/01/2012, 8:39 AM

## 2012-05-01 NOTE — Progress Notes (Signed)
UR COMPLETED  

## 2012-05-01 NOTE — Evaluation (Signed)
Physical Therapy Evaluation Patient Details Name: Brad Velez MRN: [id number] DOB: 1951/03/11 Today's Date: 05/01/2012 Time: 4098-1191 PT Time Calculation (min): 28 min  PT Assessment / Plan / Recommendation Clinical Impression  Pt s/p R TKA presenting with increased R Knee pain, decreased R LE strength, and decreased R Knee ROM. patient motivated and anticipate patient to be safe for d/c home when appropriate. Patient currently requires increased assist for all mobility and to require 24/7 assist to prevent falls.    PT Assessment  Patient needs continued PT services    Follow Up Recommendations  Home health PT;Supervision/Assistance - 24 hour    Barriers to Discharge Decreased caregiver support pt with limited assist s/p Sunday    lEquipment Recommendations  Rolling walker with 5" wheels;3 in 1 bedside comode (pt reports equip to have been delivered)    Recommendations for Other Services     Frequency 7X/week    Precautions / Restrictions Precautions Precautions: Knee Restrictions Weight Bearing Restrictions: Yes RLE Weight Bearing: Weight bearing as tolerated   Pertinent Vitals/Pain 6-7/10 R knee pain      Mobility  Bed Mobility Bed Mobility: Supine to Sit Supine to Sit: 4: Min assist;HOB flat Details for Bed Mobility Assistance: v/c's for technique, minA for R LE Transfers Transfers: Sit to Stand;Stand to Sit Sit to Stand: 4: Min assist;With upper extremity assist;From bed;From elevated surface Stand to Sit: 4: Min assist;With upper extremity assist;To chair/3-in-1 Details for Transfer Assistance: v/c's for hand placement and R LE management Ambulation/Gait Ambulation/Gait Assistance: 4: Min assist Ambulation Distance (Feet): 25 Feet Assistive device: Rolling walker Ambulation/Gait Assistance Details: v/c's for sequencing and to maintain R quad set during stance phase to prevent R knee buckling Gait Pattern: Step-to pattern;Decreased step length -  right;Decreased stance time - right;Antalgic Gait velocity: slow General Gait Details: onset of fatigue and UE fatigue due to increased bilat UE WBIng to offset R LE WBIng Stairs: No    Exercises Total Joint Exercises Ankle Circles/Pumps: AROM;Both;10 reps;Supine Quad Sets: Right;10 reps;Supine;AROM Heel Slides: AAROM;Right;10 reps;Supine Goniometric ROM: 30 deg AA knee flex   PT Diagnosis: Difficulty walking;Abnormality of gait;Generalized weakness;Acute pain  PT Problem List: Decreased strength;Decreased range of motion;Decreased activity tolerance;Decreased balance;Decreased mobility PT Treatment Interventions: DME instruction;Gait training;Stair training;Functional mobility training;Therapeutic activities;Therapeutic exercise   PT Goals Acute Rehab PT Goals PT Goal Formulation: With patient Time For Goal Achievement: 05/08/12 Potential to Achieve Goals: Good Pt will go Supine/Side to Sit: with modified independence;with HOB 0 degrees PT Goal: Supine/Side to Sit - Progress: Goal set today Pt will go Sit to Supine/Side: with modified independence;with HOB 0 degrees PT Goal: Sit to Supine/Side - Progress: Goal set today Pt will go Sit to Stand: with modified independence;with upper extremity assist (up to RW.) PT Goal: Sit to Stand - Progress: Goal set today Pt will Ambulate: >150 feet;with modified independence;with rolling walker PT Goal: Ambulate - Progress: Goal set today Pt will Go Up / Down Stairs: 1-2 stairs;with supervision;with rolling walker PT Goal: Up/Down Stairs - Progress: Goal set today Pt will Perform Home Exercise Program: Independently PT Goal: Perform Home Exercise Program - Progress: Goal set today  Visit Information  Last PT Received On: 05/01/12 Assistance Needed: +1    Subjective Data  Subjective: Pt received supine in bed with report of 6-7/10 R knee pain Patient Stated Goal: home   Prior Functioning  Home Living Lives With: Spouse Available Help  at Discharge: Family (avail 24/7 wed-sunday, then no one during the day)  Type of Home: House Home Access: Stairs to enter ( 4in platform step) Secretary/administrator of Steps: 1 Entrance Stairs-Rails: None Home Layout: One level Bathroom Shower/Tub: Walk-in shower;Tub/shower unit Bathroom Toilet: Standard Bathroom Accessibility: Yes How Accessible: Accessible via walker Home Adaptive Equipment: Bedside commode/3-in-1;Walker - rolling;Shower chair with back;Hand-held shower hose Prior Function Level of Independence: Independent Able to Take Stairs?: Yes Driving: Yes 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 Communication: No difficulties Dominant Hand: Left    Cognition  Overall Cognitive Status: Appears within functional limits for tasks assessed/performed Arousal/Alertness: Awake/alert Orientation Level: Oriented X4 / Intact Behavior During Session: [facility] for tasks performed    Extremity/Trunk Assessment Right Upper Extremity Assessment RUE ROM/Strength/Tone: Within functional levels Left Upper Extremity Assessment LUE ROM/Strength/Tone: Within functional levels Right Lower Extremity Assessment RLE ROM/Strength/Tone: Deficits;Due to pain RLE ROM/Strength/Tone Deficits: 20 deg active R knee flexion, able to initiate R quad set Left Lower Extremity Assessment LLE ROM/Strength/Tone: Within functional levels Trunk Assessment Trunk Assessment: Normal   Balance    End of Session PT - End of Session Equipment Utilized During Treatment: Gait belt Activity Tolerance: Patient limited by fatigue Patient left: in chair;with call bell/phone within reach Nurse Communication: Mobility status   Marcene Brawn 05/01/2012, 10:03 AM  Lewis Shock, PT, DPT Pager #: [phone] Office #: [phone]

## 2012-05-01 NOTE — Evaluation (Signed)
Occupational Therapy Evaluation Patient Details Name: Brad Velez MRN: [id number] DOB: 1951/10/19 Today's Date: 05/01/2012 Time: 1914-7829 OT Time Calculation (min): 49 min  OT Assessment / Plan / Recommendation Clinical Impression  Pt presents to OT with decreased I with ADL activity s/p TKR. Pt will benefit from skilled OT to increase I with ADL activity and return to PLOF    OT Assessment  Patient needs continued OT Services    Follow Up Recommendations  No OT follow up       Equipment Recommendations  None recommended by OT       Frequency  Min 2X/week    Precautions / Restrictions Precautions Precautions: Knee Restrictions Weight Bearing Restrictions: Yes RLE Weight Bearing: Weight bearing as tolerated       ADL  Grooming: Performed;Other (comment) (putting in contacts) Where Assessed - Grooming: Supported standing;Other (comment) (walker) Upper Body Dressing: Performed;Set up Where Assessed - Upper Body Dressing: Unsupported sitting Lower Body Dressing: Performed;Minimal assistance Where Assessed - Lower Body Dressing: Sopported sit to stand Toilet Transfer: Performed;Min guard Toilet Transfer Method: Sit to Barista: Comfort height toilet;Grab bars Toileting - Clothing Manipulation and Hygiene: Performed;Min guard Where Assessed - Engineer, mining and Hygiene: Standing Equipment Used: Rolling walker    OT Diagnosis: Generalized weakness  OT Problem List: Decreased strength OT Treatment Interventions: Self-care/ADL training;Patient/family education;DME and/or AE instruction   OT Goals Acute Rehab OT Goals OT Goal Formulation: With patient Potential to Achieve Goals: Good ADL Goals Pt Will Perform Lower Body Bathing: with modified independence;Sit to stand from chair ADL Goal: Lower Body Bathing - Progress: Goal set today Pt Will Perform Lower Body Dressing: with modified independence;Sit to stand from chair ADL  Goal: Lower Body Dressing - Progress: Goal set today Pt Will Perform Tub/Shower Transfer: Shower transfer;with modified independence ADL Goal: Tub/Shower Transfer - Progress: Goal set today  Visit Information  Last OT Received On: 05/01/12 Assistance Needed: +1       Prior Functioning  Home Living Lives With: Spouse Available Help at Discharge: Family (avail 24/7 wed-sunday, then no one during the day) Type of Home: House Home Access: Stairs to enter ( 4in platform step) Secretary/administrator of Steps: 1 Entrance Stairs-Rails: None Home Layout: One level Bathroom Shower/Tub: Walk-in shower;Tub/shower unit Bathroom Toilet: Standard Bathroom Accessibility: Yes How Accessible: Accessible via walker Home Adaptive Equipment: Bedside commode/3-in-1;Walker - rolling;Shower chair with back;Hand-held shower hose Prior Function Level of Independence: Independent Able to Take Stairs?: Yes Driving: Yes 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 Communication: No difficulties Dominant Hand: Left    Cognition  Overall Cognitive Status: Appears within functional limits for tasks assessed/performed Arousal/Alertness: Awake/alert Orientation Level: Oriented X4 / Intact Behavior During Session: [facility] for tasks performed    Extremity/Trunk Assessment Right Upper Extremity Assessment RUE ROM/Strength/Tone: [facility] for tasks assessed Left Upper Extremity Assessment LUE ROM/Strength/Tone: WFL for tasks assessed Right Lower Extremity Assessment RLE ROM/Strength/Tone: Deficits;Due to pain RLE ROM/Strength/Tone Deficits: 20 deg active R knee flexion, able to initiate R quad set Left Lower Extremity Assessment LLE ROM/Strength/Tone: Within functional levels Trunk Assessment Trunk Assessment: Normal   Mobility Bed Mobility Bed Mobility: Supine to Sit Supine to Sit: 4: Min assist;HOB flat Details for Bed Mobility Assistance: v/c's for technique, minA for R  LE Transfers Transfers: Sit to Stand;Stand to Sit Sit to Stand: 4: Min assist;With upper extremity assist;From chair/3-in-1 Stand to Sit: 4: Min assist;With upper extremity assist;To chair/3-in-1 Details for Transfer Assistance: v/c's for hand  placement and R LE management   Exercise Total Joint Exercises Ankle Circles/Pumps: AROM;Both;10 reps;Supine Quad Sets: Right;10 reps;Supine;AROM Heel Slides: AAROM;Right;10 reps;Supine Goniometric ROM: 30 deg AA knee flex     End of Session OT - End of Session Activity Tolerance: Patient tolerated treatment well Patient left: in chair;with family/visitor present;with call bell/phone within reach CPM Right Knee CPM Right Knee: Off Right Knee Flexion (Degrees): 90  Right Knee Extension (Degrees): 0  Additional Comments: trapeze bar   [name], [place]ung 05/01/2012, 11:05 AM

## 2012-05-02 LAB — BASIC METABOLIC PANEL
BUN: 13 mg/dL (ref 6–23)
Chloride: 97 mEq/L (ref 96–112)
GFR calc Af Amer: >90 mL/min (ref >90)
GFR calc non Af Amer: >90 mL/min (ref >90)
Potassium: 4 mEq/L (ref 3.5–5.1)
Sodium: 132 mEq/L — ABNORMAL LOW (ref 135–145)

## 2012-05-02 LAB — CBC
HCT: 32.6 % — ABNORMAL LOW (ref 39.0–52.0)
Platelets: 163 10*3/uL (ref 150–400)
RDW: 13 % (ref 11.5–15.5)
WBC: 8.7 10*3/uL (ref 4.0–10.5)

## 2012-05-02 MED ORDER — ENOXAPARIN SODIUM 40 MG/0.4ML ~~LOC~~ SOLN: 40.0000 mg | Freq: Every day | SUBCUTANEOUS | Status: DC

## 2012-05-02 MED ORDER — POLYETHYLENE GLYCOL 3350 17 G PO PACK
17.0000 g | PACK | Freq: Every day | ORAL | Status: DC | PRN
  Administered 2012-05-02: 17 g via ORAL
  Filled 2012-05-02: qty 1

## 2012-05-02 MED ORDER — OXYCODONE HCL 5 MG PO TABS: 5.0000 mg | ORAL_TABLET | ORAL | Status: AC | PRN

## 2012-05-02 NOTE — Discharge Instructions (Signed)
Diet: As you were doing prior to hospitalization   Activity:  Increase activity slowly as tolerated                  No lifting or driving for 6 weeks  Shower:  May shower without a dressing once there is no drainage from your wound.                   Dressing:  You may change your dressing on Thursday                    Then change the dressing daily with sterile 4"x4"s gauze dressing                     And TED hose for knees.    Weight Bearing:  Weight bearing as tolerated as taught in physical therapy.  Use a                                walker or Crutches as instructed.  To prevent constipation: you may use a stool softener such as -               Colace ( over the counter) 100 mg by mouth twice a day                Drink plenty of fluids ( prune juice may be helpful) and high fiber foods                Miralax ( over the counter) for constipation as needed.    Precautions:  If you experience chest pain or shortness of breath - call 911 immediately               For transfer to the hospital emergency department!!               If you develop a fever greater that 101 F, purulent drainage from wound,                             increased redness or drainage from wound, or calf pain -- Call the office at                                                 [phone].  Follow- Up Appointment:  Please call for an appointment to be seen on 05/15/12                                              [facility] - [phone]

## 2012-05-02 NOTE — Discharge Summary (Signed)
Georgena Spurling, MD   Brad Cabal, PA-C [address] [place], [place], [place]  [address]                             [phone]  PATIENT ID: Brad Velez        MRN:  [id number]          DOB/AGE: 1951/08/23 / 61 y.o.    DISCHARGE SUMMARY  ADMISSION DATE:    04/30/2012 DISCHARGE DATE:   05/02/2012   ADMISSION DIAGNOSIS: osteoarthritis right knee    DISCHARGE DIAGNOSIS:  osteoarthritis right knee    ADDITIONAL DIAGNOSIS: Active Problems:  * No active hospital problems. *   Past Medical History  Diagnosis Date  . Hypertension     hx of but off of medication for 5 yrs  . Pneumonia     1- 2 yrs ago  . Sleep apnea     doesnt use CPAP- tested 15 yrs ago  . H/O hiatal hernia   . Arthritis   . Complication of anesthesia     had to be kept an extra hour in recorvey during colonostomy    PROCEDURE: Procedure(s): TOTAL KNEE ARTHROPLASTY on 04/30/2012  CONSULTS:     HISTORY:  See H&P in chart  HOSPITAL COURSE:  Brad Velez is a 61 y.o. admitted on 04/30/2012 and found to have a diagnosis of osteoarthritis right knee.  After appropriate laboratory studies were obtained  they were taken to the operating room on 04/30/2012 and underwent Procedure(s): TOTAL KNEE ARTHROPLASTY.   They were given perioperative antibiotics:  Anti-infectives     Start     Dose/Rate Route Frequency Ordered Stop   04/30/12 1400   ceFAZolin (ANCEF) IVPB 2 g/50 mL premix        2 g 100 mL/hr over 30 Minutes Intravenous Every 6 hours 04/30/12 1211 04/30/12 2344   04/29/12 1146   ceFAZolin (ANCEF) IVPB 2 g/50 mL premix        2 g 100 mL/hr over 30 Minutes Intravenous 60 min pre-op 04/29/12 1146 04/30/12 0750        .  Tolerated the procedure well.  Placed with a foley intraoperatively.  Given Ofirmev at induction and for 48 hours.    POD #1, allowed out of bed to a chair.  PT for ambulation and exercise program.  Foley D/C'd in morning.  IV saline locked.  O2 discontionued.  POD #2,  continued PT and ambulation.   Hemovac pulled. .  The remainder of the hospital course was dedicated to ambulation and strengthening.   The patient was discharged on 2 Days Post-Op in  Good condition.  Blood products given:none  DIAGNOSTIC STUDIES: Recent vital signs: Patient Vitals for the past 24 hrs:  BP Temp Temp src Pulse Resp SpO2 Weight  05/02/12 0555 - - - - - - 138.6 kg (305 lb 8.9 oz)  05/02/12 0523 144/66 mmHg 99.2 F (37.3 C) Oral 83  18  90 % -  2012/05/29 2239 138/69 mmHg 100 F (37.8 C) Oral 74  16  93 % -  2012-05-29 1547 - - - - 16  94 % -       Recent laboratory studies:  Basename 05/02/12 0555 05/29/12 0505  WBC 8.7 9.5  HGB 11.0* 12.6*  HCT 32.6* 37.7*  PLT 163 207    Basename 05/02/12 0555 29-May-2012 0505  NA 132* 132*  K 4.0 4.7  CL 97 96  CO2 27 29  BUN 13 15  CREATININE 0.85 1.05  GLUCOSE 104* 114*  CALCIUM 8.4 8.7   Lab Results  Component Value Date   INR 0.95 04/23/2012     Recent Radiographic Studies :  Dg Chest 2 View  04/23/2012  *RADIOLOGY REPORT*  Clinical Data: Pre op (right knee arthroplasty)  CHEST - 2 VIEW  Comparison: None.  Findings: Normal cardiac silhouette and mediastinal contours. Atherosclerotic calcifications within the aortic arch.  The lungs are hyperinflated with flattening of the bilateral hemidiaphragms. Minimal basilar linear heterogeneous opacities, left greater than right, favored to represent subsegmental atelectasis or scar.  No focal airspace opacities.  No pleural effusion or pneumothorax. Multilevel filling osteophytosis with preservation of disc spaces within the thoracic spine suggestive of DISH.  IMPRESSION: Hyperexpanded lungs without acute cardiopulmonary disease.  Original Report Authenticated By: Waynard Reeds, M.D.    DISCHARGE INSTRUCTIONS: Discharge Orders    Future Orders Please Complete By Expires   Diet - low sodium heart healthy      Call MD / Call 911      Comments:   If you experience chest pain or  shortness of breath, CALL 911 and be transported to the hospital emergency room.  If you develope a fever above 101 F, pus (white drainage) or increased drainage or redness at the wound, or calf pain, call your surgeon's office.   Constipation Prevention      Comments:   Drink plenty of fluids.  Prune juice may be helpful.  You may use a stool softener, such as Colace (over the counter) 100 mg twice a day.  Use MiraLax (over the counter) for constipation as needed.   Increase activity slowly as tolerated      Driving restrictions      Comments:   No driving for 6 weeks   Lifting restrictions      Comments:   No lifting for 6 weeks   CPM      Comments:   Continuous passive motion machine (CPM):      Use the CPM from 0 to 90 for 6-8 hours per day.      You may increase by 10 per day.  You may break it up into 2 or 3 sessions per day.      Use CPM for 2 weeks or until you are told to stop.   TED hose      Comments:   Use stockings (TED hose) for 3 weeks on both leg(s).  You may remove them at night for sleeping.   Change dressing      Comments:   Change dressing on thursday, then change the dressing daily with sterile 4 x 4 inch gauze dressing and apply TED hose.   Do not put a pillow under the knee. Place it under the heel.         DISCHARGE MEDICATIONS:   Medication List  As of 05/02/2012  3:13 PM   TAKE these medications         enoxaparin 40 MG/0.4ML injection   Commonly known as: LOVENOX   Inject 0.4 mLs (40 mg total) into the skin daily.      meloxicam 15 MG tablet   Commonly known as: MOBIC   Take 15 mg by mouth daily.      multivitamin with minerals Tabs   Take 1 tablet by mouth daily.      oxyCODONE 5 MG immediate release tablet  Commonly known as: Oxy IR/ROXICODONE   Take 1-2 tablets (5-10 mg total) by mouth every 4 (four) hours as needed for pain.      rosuvastatin 10 MG tablet   Commonly known as: CRESTOR   Take 10 mg by mouth daily.            FOLLOW  UP VISIT:   Follow-up Information    Follow up with Raymon Mutton, MD. Call on 05/15/2012.   Contact information:   [address] [place] [address] [phone]          DISPOSITION:  Home    CONDITION:  Good   [name] 05/02/2012, 3:13 PM

## 2012-05-02 NOTE — Progress Notes (Signed)
PT Progress Note:     05/02/12 1300  PT Visit Information  Last PT Received On 05/02/12  Assistance Needed +1  PT Time Calculation  PT Start Time 0737  PT Stop Time 0804  PT Time Calculation (min) 27 min  Subjective Data  Subjective "Its swollen today"  Precautions  Precautions Knee  Restrictions  RLE Weight Bearing WBAT  Cognition  Overall Cognitive Status Appears within functional limits for tasks assessed/performed  Arousal/Alertness Awake/alert  Orientation Level Oriented X4 / Intact  Behavior During Session [facility] for tasks performed  Bed Mobility  Bed Mobility Supine to Sit;Sitting - Scoot to Edge of Bed  Supine to Sit 4: Min assist;HOB flat  Sitting - Scoot to [place] of Bed 5: Supervision  Details for Bed Mobility Assistance Min (A) to bring RLE towards EOB & then pt utilized hooking technique to manage LE's to floor.  Pt reports increased difficulty lifting RLE due to swelling & feeling heavy.    Transfers  Transfers Stand to Sit;Sit to Stand  Sit to Stand 4: Min guard;With upper extremity assist;From bed  Stand to Sit 5: Supervision;With upper extremity assist;With armrests;To chair/3-in-1  Details for Transfer Assistance Pt demonstrated proper hand placement & technique.  Appeared to require a little more effort to achieve standing this session.    Ambulation/Gait  Ambulation/Gait Assistance 5: Supervision  Ambulation Distance (Feet) 120 Feet  Assistive device Rolling walker  Ambulation/Gait Assistance Details Cues for increased heel strike, increased knee extension during stance phase, & encouragement to decrease reliance of UE's on RW.     Gait Pattern Step-to pattern;Antalgic;Decreased stance time - right;Right flexed knee in stance  Stairs Yes  Stairs Assistance 4: Min guard  Stair Management Technique Step to pattern;Forwards;Backwards;With walker;No rails  Number of Stairs 1  (3x's)  Wheelchair Mobility  Wheelchair Mobility No  Balance  Balance Assessed No    PT - End of Session  Equipment Utilized During Treatment Gait belt  Activity Tolerance Patient tolerated treatment well  Patient left in chair;with call bell/phone within reach  PT - Assessment/Plan  Comments on Treatment Session Pt increased ambulation distance & did well with stair training this session.   Pt mobilizing at an adequate level to safely d/c home when MD feels appropriate.    PT Plan Discharge plan remains appropriate  PT Frequency 7X/week  Follow Up Recommendations Home health PT;Supervision/Assistance - 24 hour  Acute Rehab PT Goals  PT Goal Formulation With patient  Time For Goal Achievement 05/08/12  Potential to Achieve Goals Good  PT Goal: Supine/Side to Sit - Progress Not met  PT Goal: Sit to Stand - Progress Not met  PT Goal: Ambulate - Progress Progressing toward goal  PT Goal: Up/Down Stairs - Progress Progressing toward goal     Pain:  4/10 knee.  Premedicated.     Brad Velez, [place] [phone] 05/02/2012

## 2012-05-02 NOTE — Progress Notes (Signed)
PT Progress Note:     05/02/12 1400  PT Visit Information  Last PT Received On 05/02/12  Assistance Needed +1  PT Time Calculation  PT Start Time 1420  PT Stop Time 1448  PT Time Calculation (min) 28 min  Precautions  Precautions Knee  Restrictions  RLE Weight Bearing WBAT  Cognition  Overall Cognitive Status Appears within functional limits for tasks assessed/performed  Arousal/Alertness Awake/alert  Orientation Level Oriented X4 / Intact  Behavior During Session [facility] for tasks performed  Bed Mobility  Bed Mobility Supine to Sit;Sit to Supine  Supine to Sit HOB flat;5: Supervision  Sitting - Scoot to Edge of Bed 5: Supervision  Sit to Supine 4: Min assist;HOB flat  Details for Bed Mobility Assistance (A) to lift RLE onto bed.    Transfers  Transfers Sit to Stand;Stand to Sit  Sit to Stand 5: Supervision;With upper extremity assist;From bed  Stand to Sit 5: Supervision;With upper extremity assist;To bed  Details for Transfer Assistance Cues for use of UE's for stand>sit  Ambulation/Gait  Ambulation/Gait Assistance 5: Supervision  Ambulation Distance (Feet) 125 Feet  Assistive device Rolling walker  Ambulation/Gait Assistance Details Cues for increased step length & to attempt reciprocal gait pattern.     Gait Pattern Step-to pattern;Antalgic;Decreased stance time - right;Right flexed knee in stance  Stairs No  Wheelchair Mobility  Wheelchair Mobility No  Balance  Balance Assessed No  Exercises  Exercises Total Joint  Total Joint Exercises  Ankle Circles/Pumps AROM;Both;15 reps  [employer];Both;15 reps  Heel Slides AAROM;Right;15 reps  Hip ABduction/ADduction AAROM;Right;15 reps  Straight Leg Raises AAROM;Right;15 reps  Knee Flexion AAROM;Right;15 reps  PT - End of Session  Equipment Utilized During Treatment Gait belt  Activity Tolerance Patient tolerated treatment well  Patient left in bed;with call bell/phone within reach  PT - Assessment/Plan  PT Plan  Discharge plan remains appropriate  PT Frequency 7X/week  Follow Up Recommendations Home health PT;Supervision/Assistance - 24 hour  Equipment Recommended None recommended by OT  Acute Rehab PT Goals  Time For Goal Achievement 05/08/12  Potential to Achieve Goals Good  PT Goal: Supine/Side to Sit - Progress Progressing toward goal  PT Goal: Sit to Supine/Side - Progress Progressing toward goal  PT Goal: Sit to Stand - Progress Progressing toward goal  PT Goal: Ambulate - Progress Progressing toward goal  PT Goal: Perform Home Exercise Program - Progress Progressing toward goal     Brad Velez, [place] [phone] 05/02/2012

## 2012-05-02 NOTE — Progress Notes (Signed)
CARE MANAGEMENT NOTE 05/02/2012  Patient:  Brad Velez, Brad Velez   Account Number:  [id number]  Date Initiated:  05/02/2012  Documentation initiated by:  Vance Peper  Subjective/Objective Assessment:   61 yr old male s/p right total knee arthroplasty     Action/Plan:   Patient was preoperatively setup with [facility], no changes. patient has DME.   Anticipated DC Date:  05/03/2012   Anticipated DC Plan:  HOME W HOME HEALTH SERVICES      DC Planning Services  CM consult      [facility] Choice  HOME HEALTH   Choice offered to / List presented to:  C-1 Patient        HH arranged  HH-2 PT      [facility] agency  [facility]   Status of service:  Completed, signed off   Discharge Disposition:  HOME W HOME HEALTH SERVICES  Per UR Regulation:    If discussed at Long Length of Stay Meetings, dates discussed:    Comments:

## 2012-05-02 NOTE — Progress Notes (Signed)
  Brad Spurling, MD   Brad Cabal, PA-C [address] [place], [place], [place]  [address]                             [phone]   PROGRESS NOTE  Subjective:  negative for Chest Pain  negative for Shortness of Breath  negative for Nausea/Vomiting   negative for Calf Pain  negative for Bowel Movement   Tolerating Diet: yes         Patient reports pain as 5 on 0-10 scale.    Objective: Vital signs in last 24 hours:   Patient Vitals for the past 24 hrs:  BP Temp Temp src Pulse Resp SpO2 Weight  05/02/12 0555 - - - - - - 138.6 kg (305 lb 8.9 oz)  05/02/12 0523 144/66 mmHg 99.2 F (37.3 C) Oral 83  18  90 % -  05/01/12 2239 138/69 mmHg 100 F (37.8 C) Oral 74  16  93 % -  05/01/12 1547 - - - - 16  94 % -    @flow {1959:LAST@   Intake/Output from previous day:   06/25 0701 - 06/26 0700 In: 1140 [P.O.:1140] Out: 1500 [Urine:1500]   Intake/Output this shift:       Intake/Output      06/25 0701 - 06/26 0700 06/26 0701 - 06/27 0700   P.O. 1140    I.V. (mL/kg)     IV Piggyback     Total Intake(mL/kg) 1140 (8.2)    Urine (mL/kg/hr) 1500 (0.5)    Drains     Blood     Total Output 1500    Net -360         Urine Occurrence 1 x       LABORATORY DATA:  Basename 05/02/12 0555 05/01/12 0505  WBC 8.7 9.5  HGB 11.0* 12.6*  HCT 32.6* 37.7*  PLT 163 207    Basename 05/02/12 0555 05/01/12 0505  NA 132* 132*  K 4.0 4.7  CL 97 96  CO2 27 29  BUN 13 15  CREATININE 0.85 1.05  GLUCOSE 104* 114*  CALCIUM 8.4 8.7   Lab Results  Component Value Date   INR 0.95 04/23/2012    Examination:  General appearance: alert, cooperative and no distress Extremities: Homans sign is negative, no sign of DVT  Wound Exam: clean, dry, intact   Drainage:  None: wound tissue dry  Motor Exam: EHL and FHL Intact  Sensory Exam: Deep Peroneal normal  Vascular Exam:    Assessment:    2 Days Post-Op  Procedure(s) (LRB): TOTAL KNEE ARTHROPLASTY (Right)  ADDITIONAL DIAGNOSIS:    Active Problems:  * No active hospital problems. *   Acute Blood Loss Anemia   Plan: Physical Therapy as ordered Weight Bearing as Tolerated (WBAT)  DVT Prophylaxis:  Lovenox  DISCHARGE PLAN: Home  DISCHARGE NEEDS: HHPT, CPM, Walker and 3-in-1 comode seat         Brad Velez 05/02/2012, 3:06 PM

## 2012-05-03 ENCOUNTER — Encounter (HOSPITAL_COMMUNITY): Payer: Self-pay | Admitting: Orthopedic Surgery

## 2012-05-21 ENCOUNTER — Ambulatory Visit: Payer: BC Managed Care – PPO | Attending: Orthopedic Surgery | Admitting: Physical Therapy

## 2012-05-21 DIAGNOSIS — R262 Difficulty in walking, not elsewhere classified: Secondary | ICD-10-CM | POA: Insufficient documentation

## 2012-05-21 DIAGNOSIS — M25569 Pain in unspecified knee: Secondary | ICD-10-CM | POA: Insufficient documentation

## 2012-05-21 DIAGNOSIS — IMO0001 Reserved for inherently not codable concepts without codable children: Secondary | ICD-10-CM | POA: Insufficient documentation

## 2012-05-21 DIAGNOSIS — M25669 Stiffness of unspecified knee, not elsewhere classified: Secondary | ICD-10-CM | POA: Insufficient documentation

## 2012-05-24 ENCOUNTER — Ambulatory Visit: Payer: BC Managed Care – PPO | Admitting: Physical Therapy

## 2012-05-29 ENCOUNTER — Ambulatory Visit: Payer: BC Managed Care – PPO | Admitting: Physical Therapy

## 2012-05-31 ENCOUNTER — Ambulatory Visit: Payer: BC Managed Care – PPO | Admitting: Physical Therapy

## 2012-06-06 ENCOUNTER — Ambulatory Visit: Payer: BC Managed Care – PPO | Admitting: Rehabilitation

## 2012-06-08 ENCOUNTER — Ambulatory Visit: Payer: BC Managed Care – PPO | Attending: Orthopedic Surgery | Admitting: Physical Therapy

## 2012-06-08 DIAGNOSIS — R262 Difficulty in walking, not elsewhere classified: Secondary | ICD-10-CM | POA: Insufficient documentation

## 2012-06-08 DIAGNOSIS — M25669 Stiffness of unspecified knee, not elsewhere classified: Secondary | ICD-10-CM | POA: Insufficient documentation

## 2012-06-08 DIAGNOSIS — IMO0001 Reserved for inherently not codable concepts without codable children: Secondary | ICD-10-CM | POA: Insufficient documentation

## 2012-06-08 DIAGNOSIS — M25569 Pain in unspecified knee: Secondary | ICD-10-CM | POA: Insufficient documentation

## 2012-06-11 ENCOUNTER — Ambulatory Visit: Payer: BC Managed Care – PPO | Admitting: Physical Therapy

## 2012-06-19 ENCOUNTER — Ambulatory Visit: Payer: BC Managed Care – PPO | Admitting: Physical Therapy

## 2012-06-21 ENCOUNTER — Ambulatory Visit: Payer: BC Managed Care – PPO | Admitting: Physical Therapy

## 2012-06-26 ENCOUNTER — Ambulatory Visit: Payer: BC Managed Care – PPO | Admitting: Physical Therapy

## 2012-06-28 ENCOUNTER — Ambulatory Visit: Payer: BC Managed Care – PPO | Admitting: Physical Therapy

## 2012-10-02 ENCOUNTER — Encounter: Payer: Self-pay | Admitting: Internal Medicine

## 2012-10-31 ENCOUNTER — Encounter (HOSPITAL_COMMUNITY): Payer: Self-pay | Admitting: Emergency Medicine

## 2012-10-31 ENCOUNTER — Emergency Department (HOSPITAL_COMMUNITY): Payer: BC Managed Care – PPO

## 2012-10-31 ENCOUNTER — Inpatient Hospital Stay (HOSPITAL_COMMUNITY)
Admission: EM | Admit: 2012-10-31 | Discharge: 2012-11-02 | DRG: 069 | Disposition: A | Discharge status: Home / Self Care | Payer: BC Managed Care – PPO | Attending: Otolaryngology | Admitting: Otolaryngology

## 2012-10-31 DIAGNOSIS — Z96659 Presence of unspecified artificial knee joint: Secondary | ICD-10-CM

## 2012-10-31 DIAGNOSIS — Z79899 Other long term (current) drug therapy: Secondary | ICD-10-CM

## 2012-10-31 DIAGNOSIS — I10 Essential (primary) hypertension: Secondary | ICD-10-CM | POA: Diagnosis present

## 2012-10-31 DIAGNOSIS — M129 Arthropathy, unspecified: Secondary | ICD-10-CM | POA: Diagnosis present

## 2012-10-31 DIAGNOSIS — J0430 Supraglottitis, unspecified, without obstruction: Principal | ICD-10-CM | POA: Diagnosis present

## 2012-10-31 DIAGNOSIS — G4730 Sleep apnea, unspecified: Secondary | ICD-10-CM | POA: Diagnosis present

## 2012-10-31 DIAGNOSIS — J0431 Supraglottitis, unspecified, with obstruction: Secondary | ICD-10-CM

## 2012-10-31 DIAGNOSIS — Z87891 Personal history of nicotine dependence: Secondary | ICD-10-CM

## 2012-10-31 LAB — URINALYSIS, ROUTINE W REFLEX MICROSCOPIC
Nitrite: NEGATIVE
Urobilinogen, UA: 1 mg/dL (ref 0.0–1.0)

## 2012-10-31 LAB — CBC WITH DIFFERENTIAL/PLATELET
Basophils Absolute: 0 10*3/uL (ref 0.0–0.1)
Basophils Relative: 0 % (ref 0–1)
Eosinophils Absolute: 0 10*3/uL (ref 0.0–0.7)
Eosinophils Relative: 0 % (ref 0–5)
HCT: 45.6 % (ref 39.0–52.0)
MCHC: 34.9 g/dL (ref 30.0–36.0)
MCV: 91.8 fL (ref 78.0–100.0)
Monocytes Absolute: 1.4 10*3/uL — ABNORMAL HIGH (ref 0.1–1.0)
RDW: 13.4 % (ref 11.5–15.5)

## 2012-10-31 LAB — MRSA PCR SCREENING: MRSA by PCR: NEGATIVE

## 2012-10-31 LAB — BASIC METABOLIC PANEL
Calcium: 9.2 mg/dL (ref 8.4–10.5)
Creatinine, Ser: 0.95 mg/dL (ref 0.50–1.35)
GFR calc Af Amer: >90 mL/min (ref >90)

## 2012-10-31 LAB — TROPONIN I: Troponin I: <0.3 ng/mL (ref <0.30)

## 2012-10-31 LAB — URINE MICROSCOPIC-ADD ON

## 2012-10-31 MED ORDER — MORPHINE SULFATE 4 MG/ML IJ SOLN: 4.0000 mg | INTRAMUSCULAR | Status: DC | PRN

## 2012-10-31 MED ORDER — IBUPROFEN 100 MG/5ML PO SUSP
400.0000 mg | Freq: Once | ORAL | Status: AC
  Administered 2012-10-31: 400 mg via ORAL
  Filled 2012-10-31: qty 20

## 2012-10-31 MED ORDER — SODIUM CHLORIDE 0.9 % IV BOLUS (SEPSIS)
1000.0000 mL | Freq: Once | INTRAVENOUS | Status: AC
  Administered 2012-10-31: 1000 mL via INTRAVENOUS

## 2012-10-31 MED ORDER — DEXTROSE 5 % IV SOLN
1.0000 g | INTRAVENOUS | Status: DC
  Administered 2012-10-31: 1 g via INTRAVENOUS
  Filled 2012-10-31: qty 10

## 2012-10-31 MED ORDER — DEXAMETHASONE SODIUM PHOSPHATE 10 MG/ML IJ SOLN
10.0000 mg | Freq: Once | INTRAMUSCULAR | Status: AC
  Administered 2012-10-31: 10 mg via INTRAVENOUS
  Filled 2012-10-31: qty 1

## 2012-10-31 MED ORDER — IBUPROFEN 100 MG/5ML PO SUSP
600.0000 mg | Freq: Four times a day (QID) | ORAL | Status: DC
  Administered 2012-10-31 – 2012-11-02 (×6): 600 mg via ORAL
  Filled 2012-10-31 (×13): qty 30

## 2012-10-31 MED ORDER — ONDANSETRON HCL 4 MG PO TABS: 4.0000 mg | ORAL_TABLET | Freq: Four times a day (QID) | ORAL | Status: DC | PRN

## 2012-10-31 MED ORDER — CLINDAMYCIN PHOSPHATE 600 MG/50ML IV SOLN
600.0000 mg | Freq: Once | INTRAVENOUS | Status: AC
  Administered 2012-10-31: 600 mg via INTRAVENOUS
  Filled 2012-10-31: qty 50

## 2012-10-31 MED ORDER — SODIUM CHLORIDE 0.9 % IV SOLN
Freq: Once | INTRAVENOUS | Status: AC
  Administered 2012-10-31: 17:00:00 via INTRAVENOUS

## 2012-10-31 MED ORDER — ACETAMINOPHEN 325 MG PO TABS
650.0000 mg | ORAL_TABLET | Freq: Four times a day (QID) | ORAL | Status: DC | PRN
  Administered 2012-10-31: 650 mg via ORAL
  Filled 2012-10-31: qty 2

## 2012-10-31 MED ORDER — OXYCODONE-ACETAMINOPHEN 5-325 MG/5ML PO SOLN: 10.0000 mL | Freq: Four times a day (QID) | ORAL | Status: DC | PRN

## 2012-10-31 MED ORDER — SODIUM CHLORIDE 0.9 % IV SOLN
3.0000 g | Freq: Four times a day (QID) | INTRAVENOUS | Status: AC
  Administered 2012-10-31 – 2012-11-01 (×4): 3 g via INTRAVENOUS
  Filled 2012-10-31 (×4): qty 3

## 2012-10-31 MED ORDER — ATORVASTATIN CALCIUM 20 MG PO TABS
20.0000 mg | ORAL_TABLET | Freq: Every day | ORAL | Status: DC
  Administered 2012-11-01: 20 mg via ORAL
  Filled 2012-10-31 (×2): qty 1

## 2012-10-31 MED ORDER — KCL IN DEXTROSE-NACL 20-5-0.45 MEQ/L-%-% IV SOLN
INTRAVENOUS | Status: DC
  Administered 2012-10-31 – 2012-11-01 (×2): via INTRAVENOUS
  Filled 2012-10-31 (×4): qty 1000

## 2012-10-31 MED ORDER — LIDOCAINE VISCOUS 2 % MT SOLN
20.0000 mL | Freq: Once | OROMUCOSAL | Status: AC
  Administered 2012-10-31: 20 mL via OROMUCOSAL
  Filled 2012-10-31: qty 20

## 2012-10-31 MED ORDER — ONDANSETRON HCL 4 MG/2ML IJ SOLN: 4.0000 mg | Freq: Four times a day (QID) | INTRAMUSCULAR | Status: DC | PRN

## 2012-10-31 NOTE — Procedures (Signed)
Preop diagnosis: Supraglottitis Postop diagnosis: Same Procedure: Transnasal fiberoptic laryngoscopy Surgeon: Jenne Pane Anesth: None Compl: None Findings: There is marked edema of the right supraglottitis including the entire epiglottis and right aryepiglottic fold and arytenoid.  The epiglottis is somewhat folded and thickened.  The right arytenoid hoods over the glottis.  Vocal folds are not visualized due to edema and hooding.  Air movement is not being obstructed dangerously but mild inspiratory stridor is being caused by supraglottic edema. Description: After discussing risks, benefits, and alternatives, the fiberoptic laryngoscope was passed through the right nasal passage to view the pharynx and larynx.  Findings are above.  After completion, the scope was removed.  The patient had no complications and was returned to nursing care in stable condition. Recommendations:  I discussed the patient's situation with him and the danger to airway patency.  We discussed options for management including ICU observation, elective intubation, or elective tracheostomy.  Given that he is currently stable, having no labored breathing, and his course has improved since being here, we agreed to closely observe in an ICU setting at [facility].  He will be treated with Unasyn and IV fluids.  He will be kept NPO tonight.  He is comfortable with this approach and expresses understanding of risks and benefits.

## 2012-10-31 NOTE — ED Provider Notes (Addendum)
History     CSN: [id number]  Arrival date & time 10/31/12  1321   First MD Initiated Contact with Patient 10/31/12 1508      Chief Complaint  Patient presents with  . Oral Swelling    Throat  . Generalized Body Aches  . Fever    (Consider location/radiation/quality/duration/timing/severity/associated sxs/prior treatment) HPI Comments: 61 y/o comes in with cc of dib. Pt has been sick the past 3 days. He has a sore throat, and fevers with chills, malaise and weakness. Pt is having some difficulty with swallowing and has to occasionally spit his secretions. Has no cardiac hx, lung disorder and is on no meds. Minimal cough, no chest pain, sob.  Patient is a 61 y.o. male presenting with fever. The history is provided by the patient.  Fever Primary symptoms of the febrile illness include fever. Primary symptoms do not include headaches, cough, shortness of breath, dysuria or arthralgias.    Past Medical History  Diagnosis Date  . Hypertension     hx of but off of medication for 5 yrs  . Pneumonia     1- 2 yrs ago  . Sleep apnea     doesnt use CPAP- tested 15 yrs ago  . H/O hiatal hernia   . Arthritis   . Complication of anesthesia     had to be kept an extra hour in recorvey during [place]    Past Surgical History  Procedure Date  . Knee arthroscopy     right  . Tonsillectomy   . Total knee arthroplasty 04/30/2012    Procedure: TOTAL KNEE ARTHROPLASTY;  Surgeon: Raymon Mutton, MD;  Location: [facility] OR;  Service: Orthopedics;  Laterality: Right;  . Vasectomy     No family history on file.  History  Substance Use Topics  . Smoking status: Never Smoker   . Smokeless tobacco: Former Neurosurgeon    Quit date: 11/18/2008  . Alcohol Use: Yes     Comment: occasional      Review of Systems  Constitutional: Positive for fever and chills. Negative for activity change.  HENT: Positive for sore throat, trouble swallowing and postnasal drip. Negative for rhinorrhea, neck  pain and voice change.   Eyes: Negative for visual disturbance.  Respiratory: Negative for cough, chest tightness and shortness of breath.   Cardiovascular: Negative for chest pain.  Gastrointestinal: Negative for abdominal distention.  Genitourinary: Negative for dysuria, enuresis and difficulty urinating.  Musculoskeletal: Negative for arthralgias.  Neurological: Negative for dizziness, light-headedness and headaches.  Psychiatric/Behavioral: Negative for confusion.    Allergies  Review of patient's allergies indicates no known allergies.  Home Medications   Current Outpatient Rx  Name  Route  Sig  Dispense  Refill  . ACETAMINOPHEN 500 MG PO TABS   Oral   Take 1,000 mg by mouth every 6 (six) hours as needed. For pain         . AMOXICILLIN 500 MG PO CAPS   Oral   Take 1,000 mg by mouth 2 (two) times daily.         . MELOXICAM 15 MG PO TABS   Oral   Take 15 mg by mouth daily.         . ADULT MULTIVITAMIN W/MINERALS CH   Oral   Take 1 tablet by mouth daily.         Marland Kitchen PHENYLEPH-DOXYLAMINE-DM-APAP 5-6.25-10-325 MG/15ML PO LIQD   Oral   Take 15 mLs by mouth every 6 (six) hours as needed.  For cold         . ROSUVASTATIN CALCIUM 10 MG PO TABS   Oral   Take 10 mg by mouth daily.         Marland Kitchen SALINE NASAL SPRAY 0.65 % NA SOLN   Nasal   Place 1 spray into the nose as needed. Nasal decongestion           BP 150/77  Pulse 97  Temp 101 F (38.3 C) (Oral)  Resp 22  SpO2 92%  Physical Exam  Nursing note and vitals reviewed. Constitutional: He is oriented to person, place, and time. He appears well-developed.  HENT:  Head: Normocephalic and atraumatic.  Mouth/Throat: No oropharyngeal exudate.       anterior cervical lymphadenopathy  Eyes: Conjunctivae normal and EOM are normal. Pupils are equal, round, and reactive to light.  Neck: Normal range of motion. Neck supple. No JVD present.  Cardiovascular: Normal rate, regular rhythm and normal heart sounds.    No murmur heard. Pulmonary/Chest: Effort normal and breath sounds normal. No respiratory distress. He has no wheezes.       Some stridulous breath sounds  Abdominal: Soft. Bowel sounds are normal. He exhibits no distension. There is no tenderness. There is no rebound and no guarding.  Musculoskeletal: He exhibits no edema.  Neurological: He is alert and oriented to person, place, and time.  Skin: Skin is warm.    ED Course  Procedures (including critical care time)   Labs Reviewed  CBC WITH DIFFERENTIAL  BASIC METABOLIC PANEL  TROPONIN I  URINALYSIS, ROUTINE W REFLEX MICROSCOPIC  MAGNESIUM  RAPID STREP SCREEN   No results found.   No diagnosis found.    MDM  Pt comes in with cc of fevers, sore throat, myalgias. Pt has no medical hx. Pt sitting upright, non toxic, but certainly slightly tachyneic. His neck exam is benign. He has no cardiac or lung disorders, and exam not indicating one or the other either.  We will get chest and neck xrays and basic lab work. He has no hx of DVT, PE, and no risk factors for either (knee replacement in June), but we will closely consider that working up for PE if the workup is grossly normal. Also, flu swab and rapid strep will be sent.   Derwood Kaplan, MD 10/31/12 1558  4:55 PM ENT was consulted immediately after the neck films supports the clinical concerns for supraglottitis. Dr. Jenne Pane will comes to see the patient. Laryngoscope at bedside. Humidified air ordered. Steroids already given, ivab added.  CRITICAL CARE Performed by: Derwood Kaplan   Total critical care time: 30 minutes  Critical care time was exclusive of separately billable procedures and treating other patients.  Critical care was necessary to treat or prevent imminent or life-threatening deterioration.  Critical care was time spent personally by me on the following activities: development of treatment plan with patient and/or surrogate as well as nursing,  discussions with consultants, evaluation of patient's response to treatment, examination of patient, obtaining history from patient or surrogate, ordering and performing treatments and interventions, ordering and review of laboratory studies, ordering and review of radiographic studies, pulse oximetry and re-evaluation of patient's condition.   Derwood Kaplan, MD 10/31/12 [phone]

## 2012-10-31 NOTE — H&P (Signed)
Brad Velez is an 61 y.o. male.   Chief Complaint: throat pain HPI: 61 year old male got a flu shot six days ago.  A couple of days later, he developed fever and muscle aches.  The next day, he developed vomiting.  His throat began hurting and swallowing became more difficult.  He treated himself with OTC meds without much benefit.  Has not been able to eat or drink anything for the past few days.  Today, his breathing became more difficult and noisy.  He came to the ER where a lateral neck x-ray suggests supraglottitis.  Since being in the ER and being treated with Rocephin and Decadron, pain and swollen sensation has improved a bit.  He does not feel like breathing is difficult.  Past Medical History  Diagnosis Date  . Hypertension     hx of but off of medication for 5 yrs  . Pneumonia     1- 2 yrs ago  . Sleep apnea     doesnt use CPAP- tested 15 yrs ago  . H/O hiatal hernia   . Arthritis   . Complication of anesthesia     had to be kept an extra hour in recorvey during [place]    Past Surgical History  Procedure Date  . Knee arthroscopy     right  . Tonsillectomy   . 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 Vasectomy     No family history on file. Social History:  reports that he has never smoked. He quit smokeless tobacco use about 3 years ago. He reports that he drinks alcohol. He reports that he does not use illicit drugs.  Allergies: No Known Allergies   (Not in a hospital admission)  Results for orders placed during the hospital encounter of 10/31/12 (from the past 48 hour(s))  CBC WITH DIFFERENTIAL     Status: Abnormal   Collection Time   10/31/12  3:45 PM      Component Value Range Comment   WBC 12.7 (*) 4.0 - 10.5 K/uL    RBC 4.97  4.22 - 5.81 MIL/uL    Hemoglobin 15.9  13.0 - 17.0 g/dL    HCT 13.0  86.5 - 78.4 %    MCV 91.8   78.0 - 100.0 fL    MCH 32.0  26.0 - 34.0 pg    MCHC 34.9  30.0 - 36.0 g/dL    RDW 69.6  29.5 - 28.4 %    Platelets 193  150 - 400 K/uL    Neutrophils Relative 80 (*) 43 - 77 %    Neutro Abs 10.2 (*) 1.7 - 7.7 K/uL    Lymphocytes Relative 9 (*) 12 - 46 %    Lymphs Abs 1.1  0.7 - 4.0 K/uL    Monocytes Relative 11  3 - 12 %    Monocytes Absolute 1.4 (*) 0.1 - 1.0 K/uL    Eosinophils Relative 0  0 - 5 %    Eosinophils Absolute 0.0  0.0 - 0.7 K/uL    Basophils Relative 0  0 - 1 %    Basophils Absolute 0.0  0.0 - 0.1 K/uL   BASIC METABOLIC PANEL     Status: Abnormal   Collection Time   10/31/12  3:45 PM      Component Value Range Comment   Sodium 132 (*) 135 -  145 mEq/L    Potassium 3.8  3.5 - 5.1 mEq/L    Chloride 94 (*) 96 - 112 mEq/L    CO2 27  19 - 32 mEq/L    Glucose, Bld 105 (*) 70 - 99 mg/dL    BUN 12  6 - 23 mg/dL    Creatinine, Ser 2.95  0.50 - 1.35 mg/dL    Calcium 9.2  8.4 - 62.1 mg/dL    GFR calc non Af Amer 87 (*) >90 mL/min    GFR calc Af Amer >90  >90 mL/min   TROPONIN I     Status: Normal   Collection Time   10/31/12  3:45 PM      Component Value Range Comment   Troponin I <0.30  <0.30 ng/mL   MAGNESIUM     Status: Normal   Collection Time   10/31/12  3:45 PM      Component Value Range Comment   Magnesium 1.9  1.5 - 2.5 mg/dL   RAPID STREP SCREEN     Status: Abnormal   Collection Time   10/31/12  3:54 PM      Component Value Range Comment   Streptococcus, Group A Screen (Direct) POSITIVE (*) NEGATIVE   URINALYSIS, ROUTINE W REFLEX MICROSCOPIC     Status: Abnormal   Collection Time   10/31/12  4:16 PM      Component Value Range Comment   Color, Urine AMBER (*) YELLOW BIOCHEMICALS MAY BE AFFECTED BY COLOR   APPearance CLOUDY (*) CLEAR    Specific Gravity, Urine 1.042 (*) 1.005 - 1.030    pH 5.5  5.0 - 8.0    Glucose, UA NEGATIVE  NEGATIVE mg/dL    Hgb urine dipstick NEGATIVE  NEGATIVE    Bilirubin Urine SMALL (*) NEGATIVE    Ketones, ur TRACE (*)  NEGATIVE mg/dL    Protein, ur 308 (*) NEGATIVE mg/dL    Urobilinogen, UA 1.0  0.0 - 1.0 mg/dL    Nitrite NEGATIVE  NEGATIVE    Leukocytes, UA NEGATIVE  NEGATIVE   URINE MICROSCOPIC-ADD ON     Status: Normal   Collection Time   10/31/12  4:16 PM      Component Value Range Comment   WBC, UA 0-2  <3 WBC/hpf    RBC / HPF 0-2  <3 RBC/hpf    Bacteria, UA RARE  RARE    Urine-Other MUCOUS PRESENT      Dg Neck Soft Tissue  10/31/2012  *RADIOLOGY REPORT*  Clinical Data: Swelling.  Generalized body aches and fever.  NECK SOFT TISSUES - 1+ VIEW  Comparison: None.  Findings: The epiglottis and aryepiglottic folds appear markedly swollen.  Mucosa of the arytenoid cartilage also appears swollen. Prevertebral soft tissues are unremarkable.  No focal bony abnormality.  IMPRESSION: Findings highly suspicious for epiglottitis.   Original Report Authenticated By: Holley Dexter, M.D.    Dg Chest 2 View  10/31/2012  *RADIOLOGY REPORT*  Clinical Data: Fever, cough and sore throat.  CHEST - 2 VIEW  Comparison: Plain films of the chest 04/23/2012.  Findings: Lung volumes are lower than on the comparison study with some increased left basilar atelectasis.  No pneumothorax or effusion.  Heart size upper normal.  IMPRESSION: No acute finding.   Original Report Authenticated By: Holley Dexter, M.D.     Review of Systems  Constitutional: Positive for fever, chills and malaise/fatigue.  HENT: Positive for sore throat.   Respiratory: Positive for shortness of breath and stridor.  All other systems reviewed and are negative.    Blood pressure 150/77, pulse 97, temperature 101 F (38.3 C), temperature source Oral, resp. rate 22, SpO2 92.00%. Physical Exam  Constitutional: He is oriented to person, place, and time. He appears well-developed and well-nourished. No distress.  HENT:  Head: Normocephalic and atraumatic.  Right Ear: External ear normal.  Left Ear: External ear normal.  Nose: Nose normal.   Mouth/Throat: Oropharynx is clear and moist.       Voice gravely.  Mild inspiratory stridor with deep inspiration.  OC/OP normal.  Eyes: Conjunctivae normal and EOM are normal. Pupils are equal, round, and reactive to light.  Neck: Normal range of motion. Neck supple.       Right zone 2 tenderness, no crepitance or mass.  Cardiovascular: Normal rate.   Respiratory: Effort normal.  GI:       Did not examine.  Genitourinary:       Did not examine.  Musculoskeletal: Normal range of motion.  Neurological: He is alert and oriented to person, place, and time. No cranial nerve deficit.  Skin: Skin is warm and dry.  Psychiatric: He has a normal mood and affect. His behavior is normal. Judgment and thought content normal.     Assessment/Plan Acute supraglottitis The patient's symptoms are suggestive of supraglottitis.  I will evaluate the pharynx and larynx with fiberoptic endoscopy.  Recommendations will be included on the procedure note.  Yvonne Petite 10/31/2012, 5:12 PM

## 2012-10-31 NOTE — ED Notes (Signed)
RN to obtain labs with start of IV 

## 2012-10-31 NOTE — ED Notes (Addendum)
Pt states that he began having generalized body aches on Saturday, sore throat that began on Monday. Pt reports difficulty swallowing, shortness of breath when laying down, oral airway observed: airway is intact and non-obstructed. Pt reports not being able to eat foods or drink beverages. Pt has taken 1000 mg of amoxicillin from an old Rx at 8 am and 12 noon. Pt also had flu/cold medication at 10 am.

## 2012-11-01 LAB — CBC WITH DIFFERENTIAL/PLATELET
Basophils Absolute: 0 10*3/uL (ref 0.0–0.1)
Eosinophils Relative: 0 % (ref 0–5)
HCT: 46.1 % (ref 39.0–52.0)
Hemoglobin: 15.2 g/dL (ref 13.0–17.0)
Lymphocytes Relative: 5 % — ABNORMAL LOW (ref 12–46)
Lymphs Abs: 0.6 10*3/uL — ABNORMAL LOW (ref 0.7–4.0)
MCV: 92.8 fL (ref 78.0–100.0)
Monocytes Absolute: 0.4 10*3/uL (ref 0.1–1.0)
Monocytes Relative: 3 % (ref 3–12)
Neutro Abs: 10.9 10*3/uL — ABNORMAL HIGH (ref 1.7–7.7)
RBC: 4.97 MIL/uL (ref 4.22–5.81)
RDW: 13.7 % (ref 11.5–15.5)
WBC: 11.9 10*3/uL — ABNORMAL HIGH (ref 4.0–10.5)

## 2012-11-01 LAB — BASIC METABOLIC PANEL
CO2: 29 mEq/L (ref 19–32)
Calcium: 9.1 mg/dL (ref 8.4–10.5)
Chloride: 98 mEq/L (ref 96–112)
Creatinine, Ser: 0.84 mg/dL (ref 0.50–1.35)
Glucose, Bld: 186 mg/dL — ABNORMAL HIGH (ref 70–99)

## 2012-11-01 LAB — INFLUENZA PANEL BY PCR (TYPE A & B)
H1N1 flu by pcr: NOT DETECTED
Influenza A By PCR: NEGATIVE

## 2012-11-01 MED ORDER — WHITE PETROLATUM GEL
Status: AC
  Administered 2012-11-01: 14:00:00
  Filled 2012-11-01: qty 5

## 2012-11-01 NOTE — Progress Notes (Signed)
  Subjective: Feels less throat pain.  Swallow of saliva seems more comfortable.  Feels less swelling.  No breathing difficulty.  Objective: Vital signs in last 24 hours: Temp:  [97.7 F (36.5 C)-101 F (38.3 C)] 97.8 F (36.6 C) (12/26 0749) Pulse Rate:  [53-97] 60  (12/26 1000) Resp:  [12-35] 18  (12/26 1000) BP: (123-163)/(63-96) 128/78 mmHg (12/26 1000) SpO2:  [89 %-100 %] 93 % (12/26 1000) Weight:  [129.2 kg (284 lb 13.4 oz)] 129.2 kg (284 lb 13.4 oz) (12/26 0500)    Intake/Output from previous day: 12/25 0701 - 12/26 0700 In: 1850 [I.V.:1650; IV Piggyback:200] Out: 650 [Urine:650] Intake/Output this shift: Total I/O In: 400 [I.V.:300; IV Piggyback:100] Out: 250 [Urine:250]  General appearance: alert, cooperative and no distress Neck: Non-tender.  Voice remains somewhat muffled.  Soft rumble sound with deep inspiration.  Lab Results:   Basename 11/01/12 0501 10/31/12 1545  WBC 11.9* 12.7*  HGB 15.2 15.9  HCT 46.1 45.6  PLT 196 193   BMET  Basename 11/01/12 0501 10/31/12 1545  NA 136 132*  K 3.6 3.8  CL 98 94*  CO2 29 27  GLUCOSE 186* 105*  BUN 14 12  CREATININE 0.84 0.95  CALCIUM 9.1 9.2   PT/INR No results found for this basename: LABPROT:2,INR:2 in the last 72 hours ABG No results found for this basename: PHART:2,PCO2:2,PO2:2,HCO3:2 in the last 72 hours  Studies/Results: Dg Neck Soft Tissue  10/31/2012  *RADIOLOGY REPORT*  Clinical Data: Swelling.  Generalized body aches and fever.  NECK SOFT TISSUES - 1+ VIEW  Comparison: None.  Findings: The epiglottis and aryepiglottic folds appear markedly swollen.  Mucosa of the arytenoid cartilage also appears swollen. Prevertebral soft tissues are unremarkable.  No focal bony abnormality.  IMPRESSION: Findings highly suspicious for epiglottitis.   Original Report Authenticated By: Holley Dexter, M.D.    Dg Chest 2 View  10/31/2012  *RADIOLOGY REPORT*  Clinical Data: Fever, cough and sore throat.  CHEST -  2 VIEW  Comparison: Plain films of the chest 04/23/2012.  Findings: Lung volumes are lower than on the comparison study with some increased left basilar atelectasis.  No pneumothorax or effusion.  Heart size upper normal.  IMPRESSION: No acute finding.   Original Report Authenticated By: Holley Dexter, M.D.     Anti-infectives: Anti-infectives     Start     Dose/Rate Route Frequency Ordered Stop   10/31/12 2100   Ampicillin-Sulbactam (UNASYN) 3 g in sodium chloride 0.9 % 100 mL IVPB        3 g 100 mL/hr over 60 Minutes Intravenous Every 6 hours 10/31/12 2011 11/01/12 2059   10/31/12 1615   cefTRIAXone (ROCEPHIN) 1 g in dextrose 5 % 50 mL IVPB  Status:  Discontinued        1 g 100 mL/hr over 30 Minutes Intravenous Every 24 hours 10/31/12 1613 10/31/12 2011   10/31/12 1615   clindamycin (CLEOCIN) IVPB 600 mg        600 mg 100 mL/hr over 30 Minutes Intravenous  Once 10/31/12 1613 10/31/12 1750          Assessment/Plan: Supraglottitis with partial airway obstruction The airway will be examined again by fiberoptic laryngoscope.  Symptomatically, he is stable and starting to improve.  Plan will be in the procedure note.  LOS: 1 day    Brad Velez 11/01/2012

## 2012-11-01 NOTE — Procedures (Signed)
Preop diagnosis: Supraglottitis Postop diagnosis: same Procedure: Transnasal fiberoptic laryngoscopy Surgeon: Jenne Pane Anesth: None Compl: None Findings: Epiglottis remains thickened and inflamed, today with a yellow area on the right side.  Right arytenoid remains inflamed, red, and edematous.  Continues to hood the glottis.  Today, I can see the anterior commissure of the glottis but no more. Description:  After discussing risks, benefits, and alternatives, the fiberoptic laryngoscope was introduced through the right nasal passage and used to evaluate the pharynx and larynx.  Findings above.  The scope was removed and the patient returned to nursing care in stable condition with no complications. Plan: The larynx remains quite inflamed but symptoms have improved.  He will be moved to regular room with continuous pulse ox and started on a diet.  He will continue on IV Unasyn for at least another day before discharging home on Augmentin.

## 2012-11-02 MED ORDER — AMOXICILLIN-POT CLAVULANATE 875-125 MG PO TABS: 1.0000 | ORAL_TABLET | Freq: Two times a day (BID) | ORAL | Status: DC

## 2012-11-02 NOTE — Progress Notes (Signed)
Discharge home.

## 2012-11-02 NOTE — Discharge Summary (Signed)
Physician Discharge Summary  Patient ID: Brad Velez MRN: [id number] DOB/AGE: 1951-09-19 61 y.o.  Admit date: 10/31/2012 Discharge date: 11/02/2012  Admission Diagnoses:Adult supraglottitis  Discharge Diagnoses:  Active Problems:  * No active hospital problems. *    Discharged Condition: good  Hospital Course: Improved over the course of IV Abx.   Consults: none  Significant Diagnostic Studies: none  Treatments: antibiotics: Unasyn  Discharge Exam: Blood pressure 142/65, pulse 59, temperature 97.5 F (36.4 C), temperature source Oral, resp. rate 18, height 6' (1.829 m), weight 284 lb 13.4 oz (129.2 kg), SpO2 95.00%. PHYSICAL EXAM: No stridor, eating and drinking great.   Disposition: 06-Home-Health Care Svc  Discharge Orders    Future Appointments: Provider: Department: Dept Phone: Center:   11/19/2012 4:00 PM [facility] Previsit Rm [facility] [phone] [facility]   11/30/2012 1:30 PM Hart Carwin, MD [facility] [phone] [facility]     Future Orders Please Complete By Expires   Diet - low sodium heart healthy      Increase activity slowly          Medication List     As of 11/02/2012 11:13 AM    TAKE these medications         acetaminophen 500 MG tablet   Commonly known as: TYLENOL   Take 1,000 mg by mouth every 6 (six) hours as needed. For pain      amoxicillin 500 MG capsule   Commonly known as: AMOXIL   Take 1,000 mg by mouth 2 (two) times daily.      amoxicillin-clavulanate 875-125 MG per tablet   Commonly known as: AUGMENTIN   Take 1 tablet by mouth 2 (two) times daily.      meloxicam 15 MG tablet   Commonly known as: MOBIC   Take 15 mg by mouth daily.      multivitamin with minerals Tabs   Take 1 tablet by mouth daily.      rosuvastatin 10 MG tablet   Commonly known as: CRESTOR   Take 10 mg by mouth daily.      sodium chloride 0.65 % nasal spray   Commonly known as: OCEAN   Place 1  spray into the nose as needed. Nasal decongestion      TYLENOL COLD MULTI-SYMPTOM 5-6.25-10-325 MG/15ML Liqd   Generic drug: Phenyleph-Doxylamine-DM-APAP   Take 15 mLs by mouth every 6 (six) hours as needed. For cold           Follow-up Information    Follow up with BATES, DWIGHT, MD. Schedule an appointment as soon as possible for a visit in 1 week.   Contact information:   [address] [place] [address] [phone]          Signed: Serena Colonel 11/02/2012, 11:13 AM

## 2012-11-02 NOTE — Progress Notes (Signed)
Subjective: Feels great, ready to go home. No trouble breathing, no pain and swallowing normally now.  Objective: Vital signs in last 24 hours: Temp:  [97.3 F (36.3 C)-98.2 F (36.8 C)] 97.5 F (36.4 C) (12/27 0521) Pulse Rate:  [59-69] 59  (12/27 0521) Resp:  [16-20] 18  (12/27 0521) BP: (133-142)/(60-73) 142/65 mmHg (12/27 0521) SpO2:  [93 %-98 %] 95 % (12/27 0521) Weight change:  Last BM Date: 11/01/12  Intake/Output from previous day: 12/26 0701 - 12/27 0700 In: 1040 [P.O.:360; I.V.:480; IV Piggyback:200] Out: 650 [Urine:650] Intake/Output this shift: Total I/O In: 240 [P.O.:240] Out: -   PHYSICAL EXAM: Voice slightly gurgly but no stridor, he is able to take a nice deep breath without any noise.  Lab Results:  [facility] 11/01/12 0501 10/31/12 1545  WBC 11.9* 12.7*  HGB 15.2 15.9  HCT 46.1 45.6  PLT 196 193   BMET  Basename 11/01/12 0501 10/31/12 1545  NA 136 132*  K 3.6 3.8  CL 98 94*  CO2 29 27  GLUCOSE 186* 105*  BUN 14 12  CREATININE 0.84 0.95  CALCIUM 9.1 9.2    Studies/Results: Dg Neck Soft Tissue  10/31/2012  *RADIOLOGY REPORT*  Clinical Data: Swelling.  Generalized body aches and fever.  NECK SOFT TISSUES - 1+ VIEW  Comparison: None.  Findings: The epiglottis and aryepiglottic folds appear markedly swollen.  Mucosa of the arytenoid cartilage also appears swollen. Prevertebral soft tissues are unremarkable.  No focal bony abnormality.  IMPRESSION: Findings highly suspicious for epiglottitis.   Original Report Authenticated By: Holley Dexter, M.D.    Dg Chest 2 View  10/31/2012  *RADIOLOGY REPORT*  Clinical Data: Fever, cough and sore throat.  CHEST - 2 VIEW  Comparison: Plain films of the chest 04/23/2012.  Findings: Lung volumes are lower than on the comparison study with some increased left basilar atelectasis.  No pneumothorax or effusion.  Heart size upper normal.  IMPRESSION: No acute finding.   Original Report Authenticated By: Holley Dexter, M.D.     Medications: I have reviewed the patient's current medications.  Assessment/Plan: Much better, discharge home on po abx.  LOS: 2 days   Jameah Rouser 11/02/2012, 11:10 AM

## 2012-11-19 ENCOUNTER — Telehealth: Payer: Self-pay | Admitting: *Deleted

## 2012-11-19 NOTE — Telephone Encounter (Signed)
No answer on home phone.  Sent no show letter

## 2012-11-22 ENCOUNTER — Encounter: Payer: Self-pay | Admitting: Internal Medicine

## 2012-11-22 ENCOUNTER — Ambulatory Visit (AMBULATORY_SURGERY_CENTER): Payer: BC Managed Care – PPO

## 2012-11-22 VITALS — Ht 74.0 in | Wt 290.2 lb

## 2012-11-22 DIAGNOSIS — Z1211 Encounter for screening for malignant neoplasm of colon: Secondary | ICD-10-CM

## 2012-11-22 MED ORDER — MOVIPREP 100 G PO SOLR: ORAL | Status: DC

## 2012-11-22 NOTE — Progress Notes (Signed)
Pt came into the office today for his pre-visit prior to his colonoscopy with Dr Juanda Chance.We were unable to find pt's records from [place] which the pt states he signed a medical release form from our office.Pt signed another medical release form which he informed me he would fax to [place] (with correct fax number) and have records faxed to the 4th floor.He will call our office if he has any problems.

## 2012-11-30 ENCOUNTER — Encounter: Payer: Self-pay | Admitting: Internal Medicine

## 2012-11-30 ENCOUNTER — Ambulatory Visit (AMBULATORY_SURGERY_CENTER): Payer: BC Managed Care – PPO | Admitting: Internal Medicine

## 2012-11-30 ENCOUNTER — Encounter: Payer: BC Managed Care – PPO | Admitting: Internal Medicine

## 2012-11-30 VITALS — BP 129/65 | HR 52 | Temp 98.3°F | Resp 17 | Ht 74.0 in | Wt 290.0 lb

## 2012-11-30 DIAGNOSIS — Z1211 Encounter for screening for malignant neoplasm of colon: Secondary | ICD-10-CM

## 2012-11-30 MED ORDER — SODIUM CHLORIDE 0.9 % IV SOLN: 500.0000 mL | INTRAVENOUS | Status: DC

## 2012-11-30 NOTE — Op Note (Signed)
 [facility] [address] [place], [address]   COLONOSCOPY PROCEDURE REPORT  PATIENT: Brad Velez, Brad Velez  MR#: [id number] BIRTHDATE: [birth year] , 61  yrs. old GENDER: Male ENDOSCOPIST: Hart Carwin, MD REFERRED BY:  Synetta Fail, M.D. PROCEDURE DATE:  11/30/2012 PROCEDURE:   Colonoscopy, screening ASA CLASS:   Class II INDICATIONS:Average risk patient for colon cancer and had a screening colonoscopy in [place] about 8 years ago, records pending. MEDICATIONS: MAC sedation, administered by CRNA and propofol (Diprivan) 200mg  IV  DESCRIPTION OF PROCEDURE:   After the risks and benefits and of the procedure were explained, informed consent was obtained.  A digital rectal exam revealed no abnormalities of the rectum.    The LB [id number]  endoscope was introduced through the anus and advanced to the cecum, which was identified by both the appendix and ileocecal valve .  The quality of the prep was good, using MoviPrep .  The instrument was then slowly withdrawn as the colon was fully examined.     COLON FINDINGS: Mild diverticulosis was noted in the sigmoid colon. Retroflexed views revealed no abnormalities.     The scope was then withdrawn from the patient and the procedure completed.  COMPLICATIONS: There were no complications. ENDOSCOPIC IMPRESSION: Mild diverticulosis was noted in the sigmoid colon  RECOMMENDATIONS: High fiber diet   REPEAT EXAM: In 10 year(s)  for Colonoscopy.  cc:  _______________________________ eSignedHart Carwin, MD 11/30/2012 1:53 PM

## 2012-11-30 NOTE — Patient Instructions (Addendum)
YOU HAD AN ENDOSCOPIC PROCEDURE TODAY AT THE  ENDOSCOPY CENTER: Refer to the procedure report that was given to you for any specific questions about what was found during the examination.  If the procedure report does not answer your questions, please call your gastroenterologist to clarify.  If you requested that your care partner not be given the details of your procedure findings, then the procedure report has been included in a sealed envelope for you to review at your convenience later.  YOU SHOULD EXPECT: Some feelings of bloating in the abdomen. Passage of more gas than usual.  Walking can help get rid of the air that was put into your GI tract during the procedure and reduce the bloating. If you had a lower endoscopy (such as a colonoscopy or flexible sigmoidoscopy) you may notice spotting of blood in your stool or on the toilet paper. If you underwent a bowel prep for your procedure, then you may not have a normal bowel movement for a few days.  DIET: Your first meal following the procedure should be a light meal and then it is ok to progress to your normal diet.  A half-sandwich or bowl of soup is an example of a good first meal.  Heavy or fried foods are harder to digest and may make you feel nauseous or bloated.  Likewise meals heavy in dairy and vegetables can cause extra gas to form and this can also increase the bloating.  Drink plenty of fluids but you should avoid alcoholic beverages for 24 hours.  ACTIVITY: Your care partner should take you home directly after the procedure.  You should plan to take it easy, moving slowly for the rest of the day.  You can resume normal activity the day after the procedure however you should NOT DRIVE or use heavy machinery for 24 hours (because of the sedation medicines used during the test).    SYMPTOMS TO REPORT IMMEDIATELY: A gastroenterologist can be reached at any hour.  During normal business hours, 8:30 AM to 5:00 PM Monday through Friday,  call [phone].  After hours and on weekends, please call the GI answering service at [phone]( emergency number) who will take a message and have the physician on call contact you.   Following lower endoscopy (colonoscopy or flexible sigmoidoscopy):  Excessive amounts of blood in the stool  Significant tenderness or worsening of abdominal pains  Swelling of the abdomen that is new, acute  Fever of 100F or higher  FOLLOW UP: If any biopsies were taken you will be contacted by phone or by letter within the next 1-3 weeks.  Call your gastroenterologist if you have not heard about the biopsies in 3 weeks.  Our staff will call the home number listed on your records the next business day following your procedure to check on you and address any questions or concerns that you may have at that time regarding the information given to you following your procedure. This is a courtesy call and so if there is no answer at the home number and we have not heard from you through the emergency physician on call, we will assume that you have returned to your regular daily activities without incident.  SIGNATURES/CONFIDENTIALITY: You and/or your care partner have signed paperwork which will be entered into your electronic medical record.  These signatures attest to the fact that that the information above on your After Visit Summary has been reviewed and is understood.  Full responsibility of the confidentiality  of this discharge information lies with you and/or your care-partner.   Handout on diverticulosis and high fiber diet Repeat exam in 10 years Pt and wife given a strip of pts PVC'S to take to PCP to let them evaluate

## 2012-11-30 NOTE — Progress Notes (Signed)
Patient did not experience any of the following events: a burn prior to discharge; a fall within the facility; wrong site/side/patient/procedure/implant event; or a hospital transfer or hospital admission upon discharge from the facility. (G8907)Patient did not have preoperative order for IV antibiotic SSI prophylaxis. ([id number]) ewm 

## 2012-12-03 ENCOUNTER — Telehealth: Payer: Self-pay

## 2012-12-03 NOTE — Telephone Encounter (Signed)
Left message on answering machine. 

## 2012-12-22 ENCOUNTER — Other Ambulatory Visit: Payer: Self-pay

## 2013-01-14 ENCOUNTER — Other Ambulatory Visit: Payer: Self-pay | Admitting: Orthopedic Surgery

## 2013-01-14 DIAGNOSIS — M25511 Pain in right shoulder: Secondary | ICD-10-CM

## 2013-01-20 ENCOUNTER — Ambulatory Visit
Admission: RE | Admit: 2013-01-20 | Discharge: 2013-01-20 | Disposition: A | Discharge status: Home / Self Care | Payer: BC Managed Care – PPO | Source: Ambulatory Visit | Attending: Orthopedic Surgery | Admitting: Orthopedic Surgery

## 2013-01-20 DIAGNOSIS — M25511 Pain in right shoulder: Secondary | ICD-10-CM

## 2013-09-12 ENCOUNTER — Other Ambulatory Visit: Payer: Self-pay

## 2014-07-17 ENCOUNTER — Other Ambulatory Visit: Payer: Self-pay | Admitting: Orthopedic Surgery

## 2014-07-17 DIAGNOSIS — M25512 Pain in left shoulder: Secondary | ICD-10-CM

## 2014-08-08 ENCOUNTER — Other Ambulatory Visit: Payer: BC Managed Care – PPO

## 2014-08-08 ENCOUNTER — Ambulatory Visit
Admission: RE | Admit: 2014-08-08 | Discharge: 2014-08-08 | Disposition: A | Discharge status: Home / Self Care | Payer: Medicare Other | Source: Ambulatory Visit | Attending: Orthopedic Surgery | Admitting: Orthopedic Surgery

## 2014-08-08 DIAGNOSIS — M25512 Pain in left shoulder: Secondary | ICD-10-CM

## 2014-09-16 ENCOUNTER — Ambulatory Visit: Payer: Medicare Other | Attending: Physical Therapy | Admitting: Physical Therapy

## 2014-09-16 DIAGNOSIS — Z96651 Presence of right artificial knee joint: Secondary | ICD-10-CM | POA: Diagnosis not present

## 2014-09-16 DIAGNOSIS — M25619 Stiffness of unspecified shoulder, not elsewhere classified: Secondary | ICD-10-CM | POA: Insufficient documentation

## 2014-09-16 DIAGNOSIS — Z5189 Encounter for other specified aftercare: Secondary | ICD-10-CM | POA: Insufficient documentation

## 2014-09-16 DIAGNOSIS — Z9889 Other specified postprocedural states: Secondary | ICD-10-CM | POA: Diagnosis not present

## 2014-09-16 DIAGNOSIS — M25519 Pain in unspecified shoulder: Secondary | ICD-10-CM | POA: Insufficient documentation

## 2014-09-23 ENCOUNTER — Ambulatory Visit: Payer: Medicare Other | Admitting: Physical Therapy

## 2014-09-23 DIAGNOSIS — Z5189 Encounter for other specified aftercare: Secondary | ICD-10-CM | POA: Diagnosis not present

## 2014-09-25 ENCOUNTER — Ambulatory Visit: Payer: Medicare Other | Admitting: Physical Therapy

## 2014-09-25 DIAGNOSIS — Z5189 Encounter for other specified aftercare: Secondary | ICD-10-CM | POA: Diagnosis not present

## 2014-09-29 ENCOUNTER — Ambulatory Visit: Payer: Medicare Other

## 2014-09-29 DIAGNOSIS — Z5189 Encounter for other specified aftercare: Secondary | ICD-10-CM | POA: Diagnosis not present

## 2014-10-01 ENCOUNTER — Ambulatory Visit: Payer: Medicare Other | Admitting: Physical Therapy

## 2014-10-07 ENCOUNTER — Ambulatory Visit: Payer: Medicare Other | Attending: Orthopedic Surgery | Admitting: Physical Therapy

## 2014-10-07 DIAGNOSIS — Z5189 Encounter for other specified aftercare: Secondary | ICD-10-CM | POA: Insufficient documentation

## 2014-10-07 DIAGNOSIS — M25619 Stiffness of unspecified shoulder, not elsewhere classified: Secondary | ICD-10-CM | POA: Insufficient documentation

## 2014-10-07 DIAGNOSIS — Z9889 Other specified postprocedural states: Secondary | ICD-10-CM | POA: Insufficient documentation

## 2014-10-07 DIAGNOSIS — M25519 Pain in unspecified shoulder: Secondary | ICD-10-CM | POA: Insufficient documentation

## 2014-10-07 DIAGNOSIS — Z96651 Presence of right artificial knee joint: Secondary | ICD-10-CM | POA: Insufficient documentation

## 2014-10-09 ENCOUNTER — Ambulatory Visit: Payer: Medicare Other | Admitting: Physical Therapy

## 2014-10-10 ENCOUNTER — Ambulatory Visit: Payer: Medicare Other | Admitting: Physical Therapy

## 2014-10-10 DIAGNOSIS — Z5189 Encounter for other specified aftercare: Secondary | ICD-10-CM | POA: Diagnosis not present

## 2014-10-14 ENCOUNTER — Ambulatory Visit: Payer: Medicare Other | Admitting: Physical Therapy

## 2014-10-16 ENCOUNTER — Ambulatory Visit: Payer: Medicare Other | Admitting: Physical Therapy

## 2014-10-16 ENCOUNTER — Encounter: Payer: Medicare Other | Admitting: Physical Therapy

## 2014-10-16 DIAGNOSIS — Z5189 Encounter for other specified aftercare: Secondary | ICD-10-CM | POA: Diagnosis not present

## 2014-10-20 ENCOUNTER — Ambulatory Visit: Payer: Medicare Other | Admitting: Physical Therapy

## 2014-10-20 DIAGNOSIS — Z5189 Encounter for other specified aftercare: Secondary | ICD-10-CM | POA: Diagnosis not present

## 2014-10-23 ENCOUNTER — Ambulatory Visit: Payer: Medicare Other | Admitting: Physical Therapy

## 2014-10-23 DIAGNOSIS — Z5189 Encounter for other specified aftercare: Secondary | ICD-10-CM | POA: Diagnosis not present

## 2014-10-27 ENCOUNTER — Ambulatory Visit: Payer: Medicare Other | Admitting: Physical Therapy

## 2014-11-03 ENCOUNTER — Ambulatory Visit: Payer: Medicare Other | Admitting: Physical Therapy

## 2014-11-03 DIAGNOSIS — Z5189 Encounter for other specified aftercare: Secondary | ICD-10-CM | POA: Diagnosis not present

## 2016-07-19 IMAGING — MR MR SHOULDER*L* W/O CM
5 series · 33 of 40 positions shown · non-contrast
Comparison: None.

CLINICAL DATA: Left shoulder pain, limited range of motion for the
past 6 weeks, pain with abduction

EXAM:
MRI OF THE LEFT SHOULDER WITHOUT CONTRAST
TECHNIQUE: Multiplanar, multisequence MR imaging of the shoulder was performed.
No intravenous contrast was administered.

[Series 3: T2 fat-sat · axial · 4.0mm · 0.55mm/px · z∈[-40,+55]mm · 8 of 22 slices shown (1 of 3)]
[im 1/22]
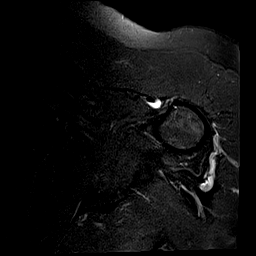
[im 3/22]
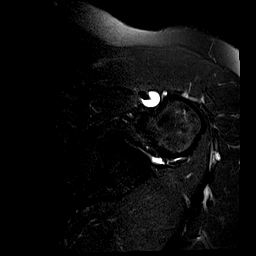
[im 8/22]
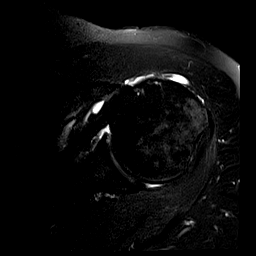
[im 10/22]
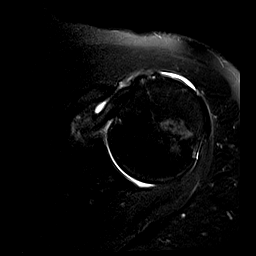
[im 12/22]
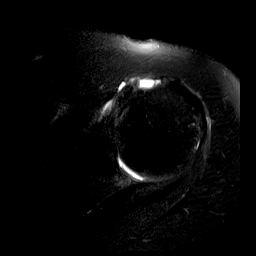
[im 15/22]
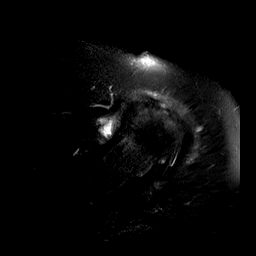
[im 19/22]
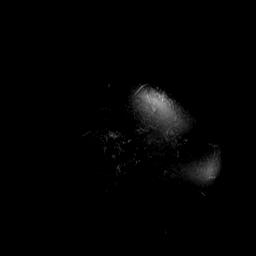
[im 22/22]
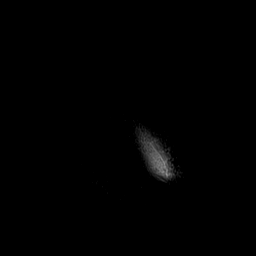

[Series 4: T2 fat-sat · oblique · 4.0mm · 0.55mm/px · 8 of 20 slices shown (2 of 3)]
[im 1/20]
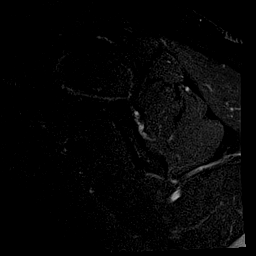
[im 3/20]
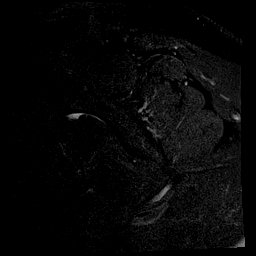
[im 6/20]
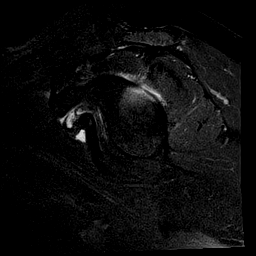
[im 9/20]
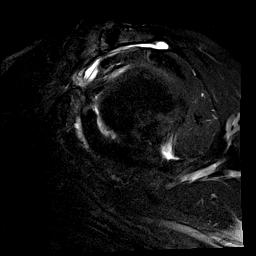
[im 11/20]
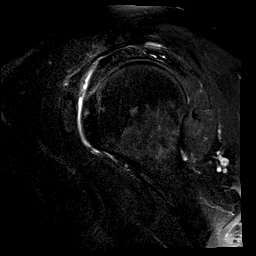
[im 14/20]
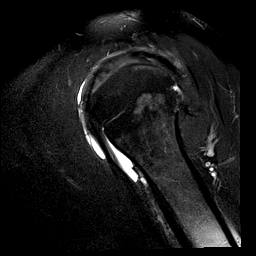
[im 17/20]
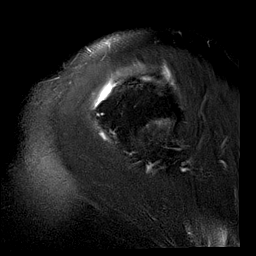
[im 20/20]
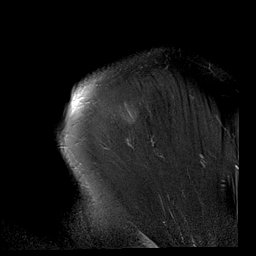

[Series 5: T2 fat-sat · oblique · 4.0mm · 0.27mm/px · 7 of 18 slices shown (3 of 3)]
[im 1/18]
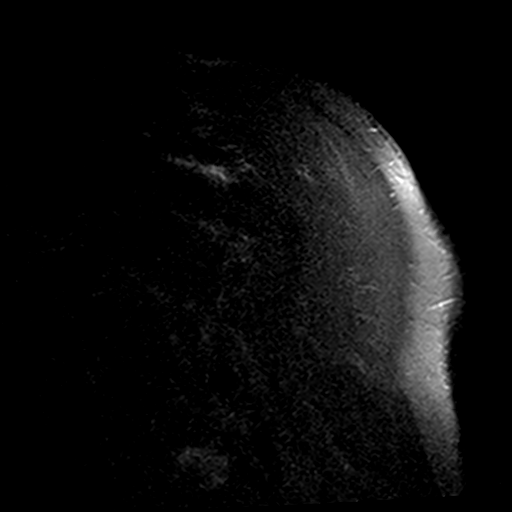
[im 3/18]
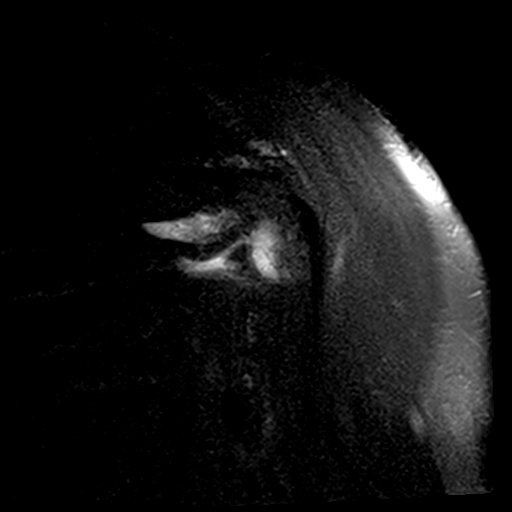
[im 6/18]
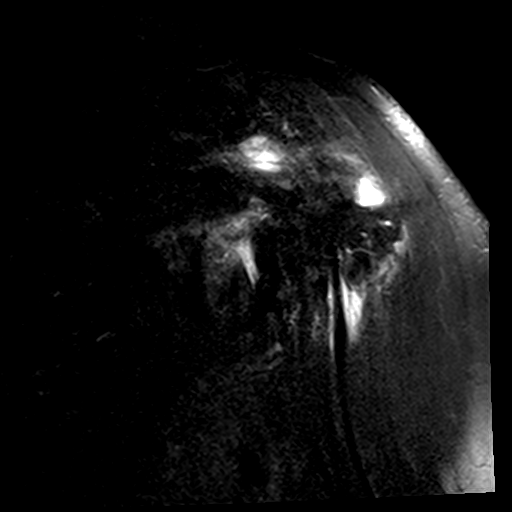
[im 9/18]
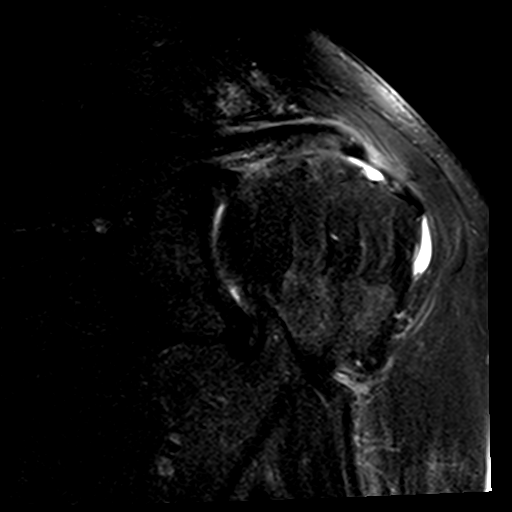
[im 12/18]
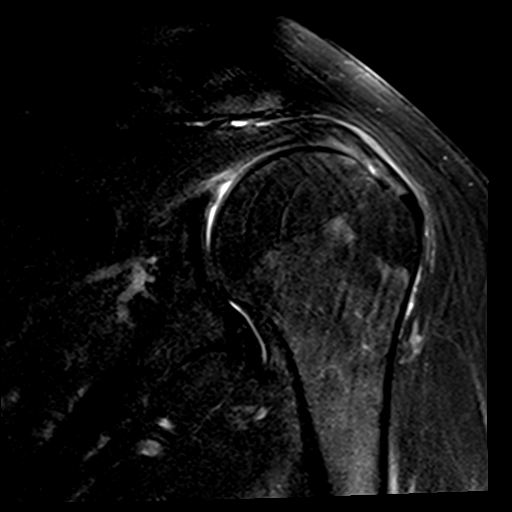
[im 15/18]
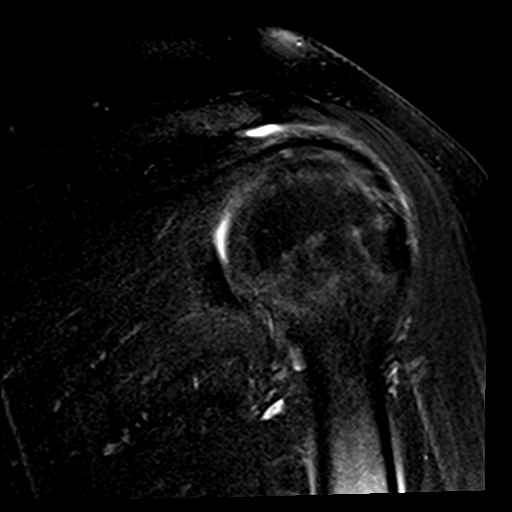
[im 18/18]
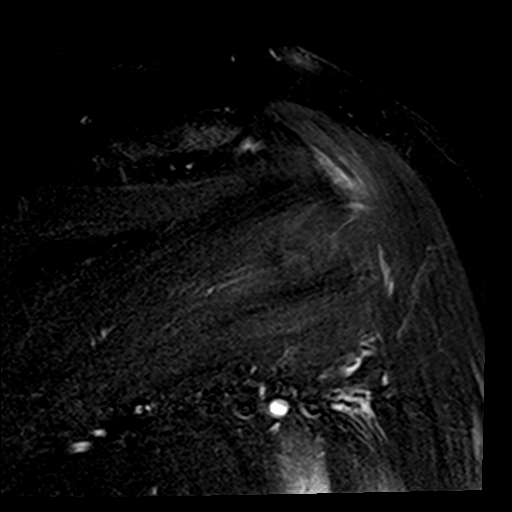

[Series 6: T1 · oblique · 4.0mm · 0.22mm/px · 3 of 20 slices shown]
[im 1/20]
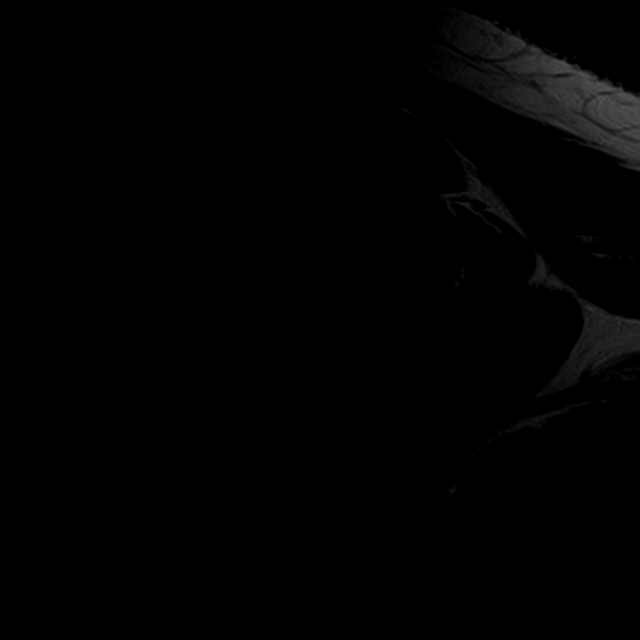
[im 3/20]
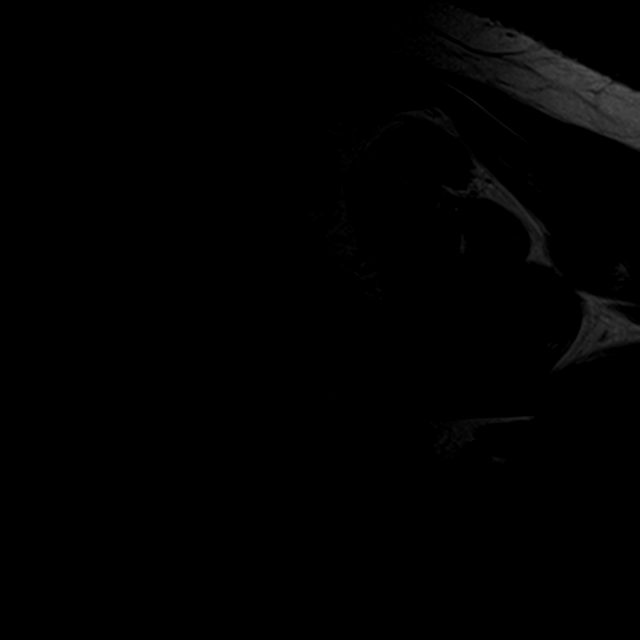
[im 6/20]
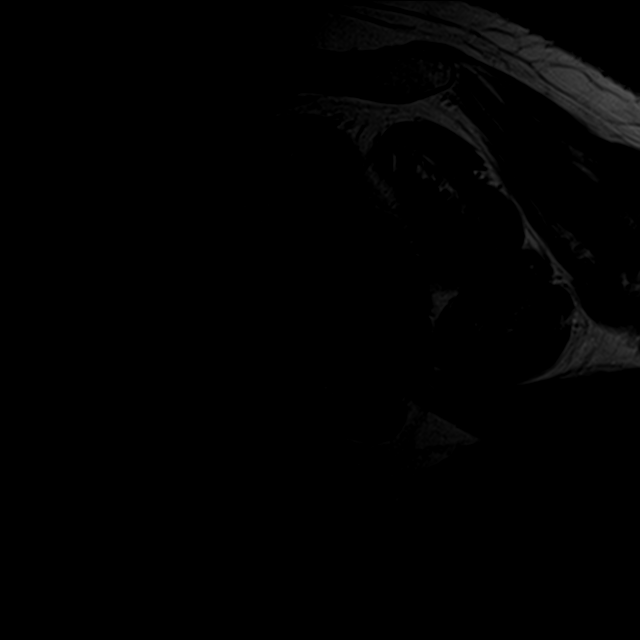

[Series 7: PD · oblique · 4.0mm · 0.44mm/px · 7 of 18 slices shown]
[im 1/18]
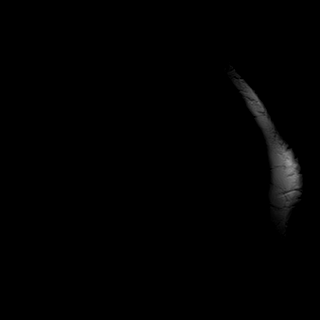
[im 3/18]
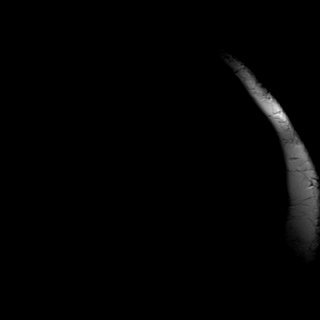
[im 6/18]
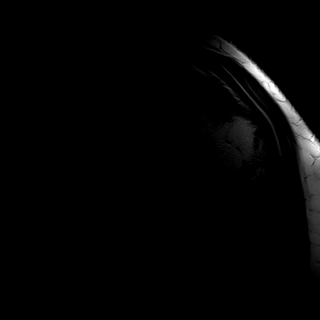
[im 9/18]
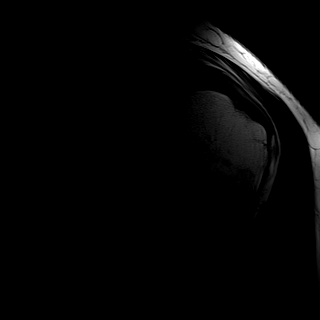
[im 12/18]
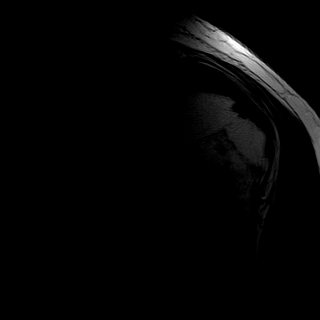
[im 15/18]
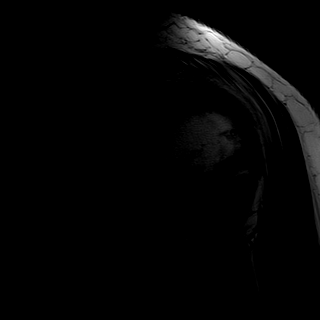
[im 18/18]
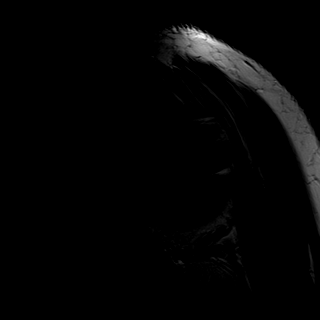

[33 of 40 positions shown; findings below may reference images not displayed]

FINDINGS: Rotator cuff: There is a full-thickness tear of the anterior
supraspinatus tendon measuring 2.4 cm in anterior-posterior
dimension. There is moderate tendinosis of the posterior intact
fibers of the supraspinatus tendon. There is moderate tendinosis of
the infraspinatus tendon . There is a small interstitial tear at the
musculotendinous junction of the infraspinatus. Teres minor tendon
is intact. Subscapularis tendon is intact.

Muscles: No atrophy or fatty replacement of nor abnormal signal
within, the muscles of the rotator cuff.

Biceps long head:  Intact.

Acromioclavicular Joint: Moderate degenerative changes of the
acromioclavicular joint. Type II acromion.

Glenohumeral Joint: No joint effusion. No chondral defect. No
dislocation.

Labrum: Grossly intact, but evaluation is limited by lack of
intraarticular fluid.

Bones: No focal marrow signal abnormality. No fracture or
dislocation.
IMPRESSION: 1. Full-thickness tear of the anterior supraspinatus tendon
measuring 2.4 cm in anterior-posterior dimension. Moderate
tendinosis of the posterior intact fibers of the supraspinatus
tendon.
2. Moderate tendinosis of the infraspinatus tendon. Small
interstitial tear at the musculotendinous junction of the
infraspinatus.

## 2016-12-13 MED FILL — traMADol HCL 50 MG TABS: 50 | 6 days supply | Qty: 45 | Fill #0

## 2016-12-13 MED FILL — ONDANSETRON HCL 4 MG TABLET: 4 | 4 days supply | Qty: 20 | Fill #0

## 2016-12-13 MED FILL — TAMSULOSIN HCL 0.4 MG CAP: 0.4 | 30 days supply | Qty: 30 | Fill #0

## 2017-02-20 ENCOUNTER — Encounter (HOSPITAL_COMMUNITY): Payer: Self-pay | Admitting: *Deleted

## 2017-02-20 ENCOUNTER — Inpatient Hospital Stay (HOSPITAL_COMMUNITY)
Admission: EM | Admit: 2017-02-20 | Discharge: 2017-02-27 | DRG: 065 | Disposition: A | Discharge status: Home / Self Care | Payer: Medicare Other | Attending: Neurological Surgery | Admitting: Neurological Surgery

## 2017-02-20 ENCOUNTER — Emergency Department (HOSPITAL_COMMUNITY): Payer: Medicare Other

## 2017-02-20 ENCOUNTER — Inpatient Hospital Stay (HOSPITAL_COMMUNITY): Payer: Medicare Other

## 2017-02-20 DIAGNOSIS — E785 Hyperlipidemia, unspecified: Secondary | ICD-10-CM | POA: Diagnosis present

## 2017-02-20 DIAGNOSIS — Z6835 Body mass index (BMI) 35.0-35.9, adult: Secondary | ICD-10-CM | POA: Diagnosis not present

## 2017-02-20 DIAGNOSIS — Z79899 Other long term (current) drug therapy: Secondary | ICD-10-CM | POA: Diagnosis not present

## 2017-02-20 DIAGNOSIS — Z96651 Presence of right artificial knee joint: Secondary | ICD-10-CM | POA: Diagnosis present

## 2017-02-20 DIAGNOSIS — Z87891 Personal history of nicotine dependence: Secondary | ICD-10-CM | POA: Diagnosis not present

## 2017-02-20 DIAGNOSIS — R51 Headache: Secondary | ICD-10-CM | POA: Diagnosis present

## 2017-02-20 DIAGNOSIS — I609 Nontraumatic subarachnoid hemorrhage, unspecified: Secondary | ICD-10-CM | POA: Diagnosis present

## 2017-02-20 DIAGNOSIS — G4730 Sleep apnea, unspecified: Secondary | ICD-10-CM | POA: Diagnosis present

## 2017-02-20 DIAGNOSIS — I493 Ventricular premature depolarization: Secondary | ICD-10-CM | POA: Diagnosis not present

## 2017-02-20 DIAGNOSIS — R9431 Abnormal electrocardiogram [ECG] [EKG]: Secondary | ICD-10-CM | POA: Diagnosis not present

## 2017-02-20 DIAGNOSIS — E669 Obesity, unspecified: Secondary | ICD-10-CM | POA: Diagnosis present

## 2017-02-20 DIAGNOSIS — I119 Hypertensive heart disease without heart failure: Secondary | ICD-10-CM | POA: Diagnosis present

## 2017-02-20 DIAGNOSIS — I472 Ventricular tachycardia: Secondary | ICD-10-CM | POA: Diagnosis not present

## 2017-02-20 DIAGNOSIS — R519 Headache, unspecified: Secondary | ICD-10-CM

## 2017-02-20 HISTORY — DX: Obesity, unspecified: E66.9

## 2017-02-20 HISTORY — PX: IR ANGIOGRAM SELECTIVE EACH ADDITIONAL VESSEL: IMG667

## 2017-02-20 HISTORY — DX: Nontraumatic subarachnoid hemorrhage, unspecified: I60.9

## 2017-02-20 HISTORY — PX: IR ANGIO EXTERNAL CAROTID SEL EXT CAROTID BILAT MOD SED: IMG5372

## 2017-02-20 HISTORY — PX: IR ANGIO VERTEBRAL SEL VERTEBRAL BILAT MOD SED: IMG5369

## 2017-02-20 HISTORY — DX: Hyperlipidemia, unspecified: E78.5

## 2017-02-20 HISTORY — DX: Ventricular premature depolarization: I49.3

## 2017-02-20 HISTORY — PX: IR ANGIO INTRA EXTRACRAN SEL INTERNAL CAROTID BILAT MOD SED: IMG5363

## 2017-02-20 LAB — BASIC METABOLIC PANEL
ANION GAP: 13 (ref 5–15)
BUN: 17 mg/dL (ref 6–20)
CO2: 22 mmol/L (ref 22–32)
Calcium: 8.9 mg/dL (ref 8.9–10.3)
Chloride: 103 mmol/L (ref 101–111)
Creatinine, Ser: 0.88 mg/dL (ref 0.61–1.24)
GFR calc Af Amer: >60 mL/min (ref >60)
GFR calc non Af Amer: >60 mL/min (ref >60)
GLUCOSE: 151 mg/dL — AB (ref 65–99)
Potassium: 3.8 mmol/L (ref 3.5–5.1)
Sodium: 138 mmol/L (ref 135–145)

## 2017-02-20 LAB — CBC WITH DIFFERENTIAL/PLATELET
Basophils Absolute: 0 10*3/uL (ref 0.0–0.1)
Basophils Relative: 0 %
Eosinophils Absolute: 0.2 10*3/uL (ref 0.0–0.7)
Eosinophils Relative: 3 %
HEMATOCRIT: 46.5 % (ref 39.0–52.0)
Hemoglobin: 15.7 g/dL (ref 13.0–17.0)
LYMPHS PCT: 31 %
Lymphs Abs: 2.6 10*3/uL (ref 0.7–4.0)
MCH: 32.4 pg (ref 26.0–34.0)
MCHC: 33.8 g/dL (ref 30.0–36.0)
MCV: 96.1 fL (ref 78.0–100.0)
MONO ABS: 0.6 10*3/uL (ref 0.1–1.0)
MONOS PCT: 7 %
Neutro Abs: 4.8 10*3/uL (ref 1.7–7.7)
Neutrophils Relative %: 59 %
Platelets: 188 10*3/uL (ref 150–400)
RBC: 4.84 MIL/uL (ref 4.22–5.81)
RDW: 13.9 % (ref 11.5–15.5)
WBC: 8.1 10*3/uL (ref 4.0–10.5)

## 2017-02-20 LAB — I-STAT CHEM 8, ED
BUN: 21 mg/dL — AB (ref 6–20)
CHLORIDE: 105 mmol/L (ref 101–111)
CREATININE: 0.7 mg/dL (ref 0.61–1.24)
Calcium, Ion: 1.09 mmol/L — ABNORMAL LOW (ref 1.15–1.40)
Glucose, Bld: 151 mg/dL — ABNORMAL HIGH (ref 65–99)
HCT: 48 % (ref 39.0–52.0)
Hemoglobin: 16.3 g/dL (ref 13.0–17.0)
POTASSIUM: 3.7 mmol/L (ref 3.5–5.1)
Sodium: 139 mmol/L (ref 135–145)
TCO2: 27 mmol/L (ref 0–100)

## 2017-02-20 LAB — PROTIME-INR
INR: 1.06
INR: 1.05
Prothrombin Time: 13.8 seconds (ref 11.4–15.2)
Prothrombin Time: 13.7 s (ref 11.4–15.2)

## 2017-02-20 LAB — I-STAT TROPONIN, ED: TROPONIN I, POC: 0 ng/mL (ref 0.00–0.08)

## 2017-02-20 LAB — CBC
HCT: 47.7 % (ref 39.0–52.0)
Hemoglobin: 16.4 g/dL (ref 13.0–17.0)
MCH: 33.2 pg (ref 26.0–34.0)
MCHC: 34.4 g/dL (ref 30.0–36.0)
MCV: 96.6 fL (ref 78.0–100.0)
Platelets: 192 10*3/uL (ref 150–400)
RBC: 4.94 MIL/uL (ref 4.22–5.81)
RDW: 13.9 % (ref 11.5–15.5)
WBC: 9.3 10*3/uL (ref 4.0–10.5)

## 2017-02-20 LAB — MRSA PCR SCREENING: MRSA BY PCR: NEGATIVE

## 2017-02-20 LAB — MAGNESIUM: Magnesium: 1.7 mg/dL (ref 1.7–2.4)

## 2017-02-20 LAB — APTT: aPTT: 29 seconds (ref 24–36)

## 2017-02-20 MED ORDER — ONDANSETRON HCL 4 MG/2ML IJ SOLN
4.0000 mg | Freq: Four times a day (QID) | INTRAMUSCULAR | Status: DC | PRN
  Administered 2017-02-23 (×2): 4 mg via INTRAVENOUS
  Filled 2017-02-20 (×2): qty 2

## 2017-02-20 MED ORDER — MIDAZOLAM HCL 2 MG/2ML IJ SOLN
INTRAMUSCULAR | Status: AC | PRN
  Administered 2017-02-20: 1 mg via INTRAVENOUS

## 2017-02-20 MED ORDER — CEFAZOLIN SODIUM-DEXTROSE 2-4 GM/100ML-% IV SOLN
2.0000 g | Freq: Three times a day (TID) | INTRAVENOUS | Status: DC
Start: 2017-02-20 — End: 2017-02-20

## 2017-02-20 MED ORDER — SODIUM CHLORIDE 0.9 % IV SOLN
INTRAVENOUS | Status: DC
  Administered 2017-02-20 – 2017-02-26 (×11): via INTRAVENOUS
  Administered 2017-02-26: 100 mL/h via INTRAVENOUS
  Administered 2017-02-27: 04:00:00 via INTRAVENOUS

## 2017-02-20 MED ORDER — CLEVIDIPINE BUTYRATE 0.5 MG/ML IV EMUL
0.0000 mg/h | INTRAVENOUS | Status: DC
  Administered 2017-02-20 (×2): 7 mg/h via INTRAVENOUS
  Administered 2017-02-21: 4 mg/h via INTRAVENOUS
  Administered 2017-02-21 (×2): 5 mg/h via INTRAVENOUS
  Filled 2017-02-20 (×6): qty 50

## 2017-02-20 MED ORDER — ONDANSETRON HCL 4 MG/2ML IJ SOLN
4.0000 mg | Freq: Once | INTRAMUSCULAR | Status: AC
  Administered 2017-02-20: 4 mg via INTRAVENOUS
  Filled 2017-02-20: qty 2

## 2017-02-20 MED ORDER — IOPAMIDOL (ISOVUE-370) INJECTION 76%
INTRAVENOUS | Status: AC
  Administered 2017-02-20: 50 mL
  Filled 2017-02-20: qty 50

## 2017-02-20 MED ORDER — HEPARIN SODIUM (PORCINE) 1000 UNIT/ML IJ SOLN
INTRAMUSCULAR | Status: AC
  Filled 2017-02-20: qty 1

## 2017-02-20 MED ORDER — DEXAMETHASONE SODIUM PHOSPHATE 10 MG/ML IJ SOLN
10.0000 mg | Freq: Once | INTRAMUSCULAR | Status: AC
  Administered 2017-02-20: 10 mg via INTRAVENOUS
  Filled 2017-02-20: qty 1

## 2017-02-20 MED ORDER — FENTANYL CITRATE (PF) 100 MCG/2ML IJ SOLN
INTRAMUSCULAR | Status: AC | PRN
  Administered 2017-02-20: 25 ug via INTRAVENOUS

## 2017-02-20 MED ORDER — DOCUSATE SODIUM 100 MG PO CAPS
100.0000 mg | ORAL_CAPSULE | Freq: Two times a day (BID) | ORAL | Status: DC
  Administered 2017-02-20 – 2017-02-27 (×14): 100 mg via ORAL
  Filled 2017-02-20 (×14): qty 1

## 2017-02-20 MED ORDER — ACETAMINOPHEN 650 MG RE SUPP: 650.0000 mg | RECTAL | Status: DC | PRN

## 2017-02-20 MED ORDER — NICARDIPINE HCL IN NACL 20-0.86 MG/200ML-% IV SOLN
INTRAVENOUS | Status: AC
  Filled 2017-02-20: qty 200

## 2017-02-20 MED ORDER — HYDROMORPHONE HCL 1 MG/ML IJ SOLN
1.0000 mg | Freq: Once | INTRAMUSCULAR | Status: AC
  Administered 2017-02-20: 1 mg via INTRAVENOUS
  Filled 2017-02-20: qty 1

## 2017-02-20 MED ORDER — CEFAZOLIN SODIUM-DEXTROSE 2-4 GM/100ML-% IV SOLN
INTRAVENOUS | Status: AC
  Administered 2017-02-20: 2 g via INTRAVENOUS
  Filled 2017-02-20: qty 100

## 2017-02-20 MED ORDER — ACETAMINOPHEN 325 MG PO TABS
650.0000 mg | ORAL_TABLET | ORAL | Status: DC | PRN
  Administered 2017-02-24 – 2017-02-26 (×2): 650 mg via ORAL
  Filled 2017-02-20 (×2): qty 2

## 2017-02-20 MED ORDER — PANTOPRAZOLE SODIUM 40 MG PO PACK
40.0000 mg | PACK | Freq: Every day | ORAL | Status: DC
  Filled 2017-02-20 (×2): qty 20

## 2017-02-20 MED ORDER — NICARDIPINE HCL IN NACL 20-0.86 MG/200ML-% IV SOLN
3.0000 mg/h | Freq: Once | INTRAVENOUS | Status: AC
  Administered 2017-02-20: 5 mg/h via INTRAVENOUS
  Filled 2017-02-20: qty 200

## 2017-02-20 MED ORDER — ACETAMINOPHEN 160 MG/5ML PO SOLN: 650.0000 mg | ORAL | Status: DC | PRN

## 2017-02-20 MED ORDER — CEFAZOLIN SODIUM-DEXTROSE 2-4 GM/100ML-% IV SOLN
2.0000 g | Freq: Once | INTRAVENOUS | Status: AC
  Administered 2017-02-20: 2 g via INTRAVENOUS

## 2017-02-20 MED ORDER — LABETALOL HCL 5 MG/ML IV SOLN: 10.0000 mg | INTRAVENOUS | Status: DC | PRN

## 2017-02-20 MED ORDER — PANTOPRAZOLE SODIUM 40 MG PO TBEC
40.0000 mg | DELAYED_RELEASE_TABLET | Freq: Every day | ORAL | Status: DC
  Administered 2017-02-21 – 2017-02-27 (×7): 40 mg via ORAL
  Filled 2017-02-20 (×7): qty 1

## 2017-02-20 MED ORDER — ACETAMINOPHEN-CODEINE #3 300-30 MG PO TABS
1.0000 | ORAL_TABLET | ORAL | Status: DC | PRN
  Administered 2017-02-21: 1 via ORAL
  Administered 2017-02-21 – 2017-02-22 (×3): 2 via ORAL
  Filled 2017-02-20 (×2): qty 2
  Filled 2017-02-20: qty 1
  Filled 2017-02-20: qty 2

## 2017-02-20 MED ORDER — ADULT MULTIVITAMIN W/MINERALS CH
1.0000 | ORAL_TABLET | Freq: Every day | ORAL | Status: DC
  Administered 2017-02-20 – 2017-02-27 (×8): 1 via ORAL
  Filled 2017-02-20 (×8): qty 1

## 2017-02-20 MED ORDER — VITAMIN B-1 100 MG PO TABS
100.0000 mg | ORAL_TABLET | Freq: Every day | ORAL | Status: DC
  Administered 2017-02-20 – 2017-02-27 (×8): 100 mg via ORAL
  Filled 2017-02-20 (×8): qty 1

## 2017-02-20 MED ORDER — LORAZEPAM 2 MG/ML IJ SOLN
1.0000 mg | INTRAMUSCULAR | Status: DC | PRN
  Administered 2017-02-24: 1 mg via INTRAVENOUS
  Filled 2017-02-20: qty 1

## 2017-02-20 MED ORDER — LIDOCAINE HCL 1 % IJ SOLN
INTRAMUSCULAR | Status: AC | PRN
  Administered 2017-02-20: 10 mL

## 2017-02-20 MED ORDER — DEXAMETHASONE SODIUM PHOSPHATE 10 MG/ML IJ SOLN
4.0000 mg | Freq: Four times a day (QID) | INTRAMUSCULAR | Status: DC
  Administered 2017-02-20 – 2017-02-22 (×6): 4 mg via INTRAVENOUS
  Filled 2017-02-20 (×6): qty 1

## 2017-02-20 MED ORDER — SODIUM CHLORIDE 0.9 % IV SOLN
500.0000 mg | Freq: Two times a day (BID) | INTRAVENOUS | Status: DC
  Administered 2017-02-20 – 2017-02-25 (×11): 500 mg via INTRAVENOUS
  Filled 2017-02-20 (×13): qty 5

## 2017-02-20 MED ORDER — IOPAMIDOL (ISOVUE-300) INJECTION 61%
INTRAVENOUS | Status: AC
  Administered 2017-02-20: 35 mL
  Filled 2017-02-20: qty 50

## 2017-02-20 MED ORDER — LABETALOL HCL 5 MG/ML IV SOLN
20.0000 mg | Freq: Once | INTRAVENOUS | Status: DC
  Filled 2017-02-20: qty 4

## 2017-02-20 MED ORDER — FOLIC ACID 1 MG PO TABS
1.0000 mg | ORAL_TABLET | Freq: Every day | ORAL | Status: DC
  Administered 2017-02-20 – 2017-02-27 (×8): 1 mg via ORAL
  Filled 2017-02-20 (×8): qty 1

## 2017-02-20 MED ORDER — MIDAZOLAM HCL 2 MG/2ML IJ SOLN
INTRAMUSCULAR | Status: AC
  Filled 2017-02-20: qty 2

## 2017-02-20 MED ORDER — IOPAMIDOL (ISOVUE-300) INJECTION 61%
INTRAVENOUS | Status: AC
  Administered 2017-02-20: 50 mL
  Filled 2017-02-20: qty 100

## 2017-02-20 MED ORDER — LIDOCAINE HCL 1 % IJ SOLN
INTRAMUSCULAR | Status: AC
  Filled 2017-02-20: qty 20

## 2017-02-20 MED ORDER — ONDANSETRON 4 MG PO TBDP: 4.0000 mg | ORAL_TABLET | Freq: Four times a day (QID) | ORAL | Status: DC | PRN

## 2017-02-20 MED ORDER — FENTANYL CITRATE (PF) 100 MCG/2ML IJ SOLN
INTRAMUSCULAR | Status: AC
  Filled 2017-02-20: qty 2

## 2017-02-20 NOTE — Brief Op Note (Signed)
PREOP DX: Subarachnoid Hemorrhage  POSTOP DX: Same  PROCEDURE: Diagnostic cerebral angiogram  SURGEON: Dr. Lisbeth Renshaw, MD  ANESTHESIA: IV Sedation with Local  EBL: Minimal  SPECIMENS: None  COMPLICATIONS: None  CONDITION: Stable to ICU  FINDINGS: 1. No intracranial aneurysms, AVM, or fistulas seen. No evidence for cervical AVM or fistula on external carotid/thyrocervical injections. 2. No vasospasm seen

## 2017-02-20 NOTE — Sedation Documentation (Signed)
IR tech holding pressure to R groin 

## 2017-02-20 NOTE — Sedation Documentation (Signed)
Patient is resting comfortably. 

## 2017-02-20 NOTE — Sedation Documentation (Signed)
exoseal R groin intact

## 2017-02-20 NOTE — Consult Note (Signed)
CC:  Chief Complaint  Patient presents with  . Hypertension    HPI: Brad Velez is a 66 y.o. male who presented to the ER after acute onset headache. Reports frontal headache started roughly 11:00am. In association with the headache, he was having neck stiffness and became diaphoretic. Was previously doing well. EMS was called and brought him to the ER. He vomited while with EMS and upon arrival to ER. He reports continued symptoms. He denies neurological deficits, weakness, changes in vision. Feels well other than HA. Reports history of HTN but since losing weight, has been well controlled without medication. He is not currently on anti-coags. Does take  ASA daily. Had a stress test end of last year which was normal.   PMH: Past Medical History:  Diagnosis Date  . Arthritis   . Complication of anesthesia 2008   had to be kept an extra hour in recorvey during [place]  . H/O hiatal hernia   . Hypertension    hx of but off of medication for 5 yrs  . Pneumonia    1- 2 yrs ago  . Sleep apnea    doesnt use CPAP- tested 15 yrs ago  . Strep throat     PSH: Past Surgical History:  Procedure Laterality Date  . COLONOSCOPY    . KNEE ARTHROSCOPY     right  . TONSILLECTOMY    . TOTAL KNEE ARTHROPLASTY  04/30/2012   Procedure: TOTAL KNEE ARTHROPLASTY;  Surgeon: Raymon Mutton, MD;  Location: [facility] OR;  Service: Orthopedics;  Laterality: Right;  . UPPER GASTROINTESTINAL ENDOSCOPY    . VASECTOMY      SH: Social History  Substance Use Topics  . Smoking status: Never Smoker  . Smokeless tobacco: Former Neurosurgeon    Types: Chew    Quit date: 11/18/2008  . Alcohol use 1.5 - 2.0 oz/week    3 - 4 drink(s) per week     Comment: occasional    MEDS: Prior to Admission medications   Medication Sig Start Date End Date Taking? Authorizing Provider  acetaminophen (TYLENOL) 500 MG tablet Take 1,000 mg by mouth every 6 (six) hours as needed. For pain    Historical Provider, MD  amoxicillin  (AMOXIL) 500 MG capsule Take 1,000 mg by mouth 2 (two) times daily.    Historical Provider, MD  amoxicillin-clavulanate (AUGMENTIN) 875-125 MG per tablet Take 1 tablet by mouth 2 (two) times daily. 11/02/12   Serena Colonel, MD  meloxicam (MOBIC) 15 MG tablet Take 15 mg by mouth daily.    Historical Provider, MD  Multiple Vitamin (MULTIVITAMIN WITH MINERALS) TABS Take 1 tablet by mouth daily. Senior Mens MVI-Take one daily    Historical Provider, MD  Phenyleph-Doxylamine-DM-APAP (TYLENOL COLD MULTI-SYMPTOM) 5-6.25-10-325 MG/15ML LIQD Take 15 mLs by mouth every 6 (six) hours as needed. For cold    Historical Provider, MD  rosuvastatin (CRESTOR) 10 MG tablet Take 10 mg by mouth daily.    Historical Provider, MD  sodium chloride (OCEAN) 0.65 % nasal spray Place 1 spray into the nose as needed. Nasal decongestion    Historical Provider, MD    ALLERGY: No Known Allergies  ROS: Review of Systems  Constitutional: Positive for diaphoresis. Negative for chills, fever, malaise/fatigue and weight loss.  HENT: Negative.   Eyes: Negative.   Respiratory: Negative.   Cardiovascular: Negative.   Gastrointestinal: Positive for nausea and vomiting. Negative for abdominal pain, blood in stool, constipation, diarrhea, heartburn and melena.  Genitourinary: Negative.  Musculoskeletal: Positive for neck pain. Negative for back pain, falls, joint pain and myalgias.  Skin: Negative.   Neurological: Positive for headaches. Negative for dizziness, tingling, tremors, sensory change, speech change, focal weakness, seizures, loss of consciousness and weakness.  Psychiatric/Behavioral: Negative.     Vitals:   02/20/17 1253 02/20/17 1415  BP: (!) 199/89 (!) 175/87  Pulse: (!) 54 (!) 136  Resp: 15 14   General appearance: Resting comfortably with wet cloth on forehead. WDWN Eyes: PERRL Cardiovascular: Regular rate and rhythm without murmurs, rubs, gallops. No edema or variciosities. Distal pulses normal. Pulmonary:  Clear to auscultation Musculoskeletal:     Muscle tone upper extremities: Normal    Muscle tone lower extremities: Normal    Motor exam: Upper Extremities Deltoid Bicep Tricep Grip  Right 5/5 5/5 5/5 5/5  Left 5/5 5/5 5/5 5/5   Lower Extremity IP Quad PF DF EHL  Right 5/5 5/5 5/5 5/5 5/5  Left 5/5 5/5 5/5 5/5 5/5   Neurological Awake, alert, oriented Memory and concentration grossly intact Speech fluent, appropriate CNII: Visual fields normal CNIII/IV/VI: EOMI CNV: Facial sensation normal CNVII: Symmetric, normal strength CNVIII: Grossly normal CNIX: Normal palate movement CNXI: Trap and SCM strength normal CN XII: Tongue protrusion normal Sensation grossly intact to LT DTR: Normal Coordination (finger/nose & heel/shin): Normal  IMAGING: CT HEAD IMPRESSION: 1. Large amount of subarachnoid hemorrhage, as above. Further evaluation with head CTA is strongly recommended at this time to evaluate for probable aneurysm.  CT Angio Head IMPRESSION: 1. No intracranial aneurysm identified to account for the subarachnoid hemorrhage. 2. Marked asymmetric attenuation of right PCA branch vessels. 3. Diffuse intracranial vascular irregularity may be artifactual and/or reflective of atherosclerosis. 4. Widely patent cervical carotid and vertebral arteries.  IMPRESSION/PLAN - 66 y.o. male with acute SAH. He is neurologically intact. At this time, no surgical intervention is necessary. Case was discussed with Dr Bevely Palmer. Dr Conchita Paris will perform angiogram today at 1600 for aneurysm R/O. Will admit to neuro ICU for monitoring and management.  - Neuro exam q 1 hour - Repeat CT scan in 12 hours, sooner as indicated.  - Start on Keppra  BID x7 days for seizure prophylaxis.  - Start Decadron - SBP goal <140

## 2017-02-20 NOTE — ED Provider Notes (Signed)
[facility]-EMERGENCY DEPT Provider Note   CSN: [id number] Arrival date & time: 02/20/17  1238     History   Chief Complaint Chief Complaint  Patient presents with  . Hypertension    HPI Brad Velez is a 66 y.o. male.  66 yo M with a cc of sudden onset headache.  Started about an hour ago.  Severe pain radiates down the back of his head into his neck. Denies trauma, denies fever.  Nothing seems to make it worse.  Severe.     The history is provided by the patient.  Hypertension  This is a new problem. The current episode started less than 1 hour ago. The problem occurs constantly. The problem has not changed since onset.Associated symptoms include headaches. Pertinent negatives include no chest pain, no abdominal pain and no shortness of breath. Nothing aggravates the symptoms. Nothing relieves the symptoms. He has tried nothing for the symptoms. The treatment provided no relief.  Headache   Pertinent negatives include no fever, no palpitations, no shortness of breath and no vomiting.    Past Medical History:  Diagnosis Date  . Arthritis   . Complication of anesthesia 2008   had to be kept an extra hour in recorvey during [place]  . H/O hiatal hernia   . Hypertension    hx of but off of medication for 5 yrs  . Pneumonia    1- 2 yrs ago  . Sleep apnea    doesnt use CPAP- tested 15 yrs ago  . Strep throat     Patient Active Problem List   Diagnosis Date Noted  . SAH (subarachnoid hemorrhage) (HCC) 02/20/2017    Past Surgical History:  Procedure Laterality Date  . COLONOSCOPY    . KNEE ARTHROSCOPY     right  . TONSILLECTOMY    . TOTAL KNEE ARTHROPLASTY  04/30/2012   Procedure: TOTAL KNEE ARTHROPLASTY;  Surgeon: Raymon Mutton, MD;  Location: [facility] OR;  Service: Orthopedics;  Laterality: Right;  . UPPER GASTROINTESTINAL ENDOSCOPY    . VASECTOMY         Home Medications    Prior to Admission medications   Medication Sig Start Date End Date Taking?  Authorizing Provider  amoxicillin (AMOXIL) 500 MG capsule Take 2,000 mg by mouth See admin instructions. One hour prior to dental appointments for pre-treatment   Yes Historical Provider, MD  aspirin EC 81 MG tablet Take 81 mg by mouth at bedtime.    Yes Historical Provider, MD  cetirizine (ZYRTEC) 10 MG tablet Take 10 mg by mouth daily.   Yes Historical Provider, MD  ibuprofen (ADVIL,MOTRIN) 200 MG tablet Take 200-400 mg by mouth every 6 (six) hours as needed (for muscle soreness).   Yes Historical Provider, MD  meloxicam (MOBIC) 15 MG tablet Take 15 mg by mouth daily.   Yes Historical Provider, MD  rosuvastatin (CRESTOR) 10 MG tablet Take 10 mg by mouth daily.   Yes Historical Provider, MD  amoxicillin-clavulanate (AUGMENTIN) 875-125 MG per tablet Take 1 tablet by mouth 2 (two) times daily. Patient not taking: Reported on 02/20/2017 11/02/12   Serena Colonel, MD    Family History No family history on file.  Social History Social History  Substance Use Topics  . Smoking status: Never Smoker  . Smokeless tobacco: Former Neurosurgeon    Types: Chew    Quit date: 11/18/2008  . Alcohol use 1.5 - 2.0 oz/week    3 - 4 drink(s) per week     Comment:  occasional     Allergies   Patient has no known allergies.   Review of Systems Review of Systems  Constitutional: Negative for chills and fever.  HENT: Negative for congestion and facial swelling.   Eyes: Negative for discharge and visual disturbance.  Respiratory: Negative for shortness of breath.   Cardiovascular: Negative for chest pain and palpitations.  Gastrointestinal: Negative for abdominal pain, diarrhea and vomiting.  Musculoskeletal: Positive for neck pain. Negative for arthralgias and myalgias.  Skin: Negative for color change and rash.  Neurological: Positive for headaches. Negative for tremors and syncope.  Psychiatric/Behavioral: Negative for confusion and dysphoric mood.     Physical Exam Updated Vital Signs BP (!) 152/75    Pulse (!) 58   Resp (!) 0   SpO2 99%   Physical Exam  Constitutional: He is oriented to person, place, and time. He appears well-developed and well-nourished. He appears toxic.  HENT:  Head: Normocephalic and atraumatic.  Eyes: EOM are normal. Pupils are equal, round, and reactive to light.  Neck: Normal range of motion. Neck supple. No JVD present.  Cardiovascular: Normal rate and regular rhythm.  Exam reveals no gallop and no friction rub.   No murmur heard. Pulmonary/Chest: No respiratory distress. He has no wheezes.  Abdominal: He exhibits no distension and no mass. There is no tenderness. There is no rebound and no guarding.  Musculoskeletal: Normal range of motion.  Neurological: He is alert and oriented to person, place, and time. He has normal strength. No cranial nerve deficit or sensory deficit. He displays a negative Romberg sign. Coordination normal. GCS eye subscore is 4. GCS verbal subscore is 5. GCS motor subscore is 6. He displays no Babinski's sign on the right side. He displays no Babinski's sign on the left side.  Reflex Scores:      Tricep reflexes are 2+ on the right side and 2+ on the left side.      Bicep reflexes are 2+ on the right side and 2+ on the left side.      Brachioradialis reflexes are 2+ on the right side and 2+ on the left side.      Patellar reflexes are 2+ on the right side and 2+ on the left side.      Achilles reflexes are 2+ on the right side and 2+ on the left side. Skin: No rash noted. He is diaphoretic. No pallor.  Psychiatric: He has a normal mood and affect. His behavior is normal.  Nursing note and vitals reviewed.    ED Treatments / Results  Labs (all labs ordered are listed, but only abnormal results are displayed) Labs Reviewed  BASIC METABOLIC PANEL - Abnormal; Notable for the following:       Result Value   Glucose, Bld 151 (*)    All other components within normal limits  I-STAT CHEM 8, ED - Abnormal; Notable for the following:     BUN 21 (*)    Glucose, Bld 151 (*)    Calcium, Ion 1.09 (*)    All other components within normal limits  CBC WITH DIFFERENTIAL/PLATELET  PROTIME-INR  MAGNESIUM  CBC  PROTIME-INR  APTT  RAPID URINE DRUG SCREEN, HOSP PERFORMED  I-STAT TROPOININ, ED    EKG  EKG Interpretation  Date/Time:  Monday February 20 2017 12:44:22 EDT Ventricular Rate:  57 PR Interval:    QRS Duration: 108 QT Interval:  465 QTC Calculation: 453 R Axis:   -58 Text Interpretation:  Sinus rhythm Paired ventricular  premature complexes Incomplete RBBB and LAFB Left ventricular hypertrophy Anterior Q waves, possibly due to LVH No significant change since last tracing Confirmed by Caeleigh Prohaska MD, DANIEL [phone]) on 02/20/2017 1:13:40 PM       Radiology Ct Angio Head W Or Wo Contrast  Result Date: 02/20/2017 CLINICAL DATA:  Subarachnoid hemorrhage. EXAM: CT ANGIOGRAPHY HEAD AND NECK TECHNIQUE: Multidetector CT imaging of the head and neck was performed using the standard protocol during bolus administration of intravenous contrast. Multiplanar CT image reconstructions and MIPs were obtained to evaluate the vascular anatomy. Carotid stenosis measurements (when applicable) are obtained utilizing NASCET criteria, using the distal internal carotid diameter as the denominator. CONTRAST:  50 mL Isovue 370 COMPARISON:  Noncontrast head CT today FINDINGS: CTA NECK FINDINGS Aortic arch: Standard 3 vessel aortic arch. Brachiocephalic and subclavian arteries are widely patent. Right carotid system: Patent without evidence of stenosis or dissection. Tortuous mid cervical ICA with minimal calcified plaque. Left carotid system: Patent without evidence of stenosis or dissection. Tortuous mid cervical ICA. Vertebral arteries: Patent and codominant without evidence of stenosis or dissection. Skeleton: Moderate cervical facet arthrosis. Left facet ankylosis at C2-3. Other neck: No mass or enlarged lymph nodes. Upper chest: Calcified right hilar  lymph nodes. Grossly clear lung apices. Review of the MIP images confirms the above findings CTA HEAD FINDINGS Anterior circulation: The internal carotid arteries are patent from skullbase to carotid termini without evidence of stenosis. ACAs and MCAs are patent without evidence of proximal branch occlusion. There is mild diffuse vessel irregularity without evidence of flow limiting proximal stenosis. No aneurysm is identified. Posterior circulation: Intracranial vertebral arteries are patent to the basilar and codominant. Streak artifact mildly limits evaluation of the distal V4 segments. Patent PICA and SCA origins are visualized bilaterally. The basilar artery is patent without significant stenosis. ACAs are patent with asymmetric attenuation of distal right PCA branch vessels. There is an irregular, mildly attenuated appearance of the P1 segments bilaterally, more so on the left which may be artifactual or reflect underlying atherosclerosis. No aneurysm is identified. Venous sinuses: Patent. Anatomic variants: None. Delayed phase: No abnormal enhancement. Review of the MIP images confirms the above findings IMPRESSION: 1. No intracranial aneurysm identified to account for the subarachnoid hemorrhage. 2. Marked asymmetric attenuation of right PCA branch vessels. 3. Diffuse intracranial vascular irregularity may be artifactual and/or reflective of atherosclerosis. 4. Widely patent cervical carotid and vertebral arteries. Electronically Signed   By: Sebastian Ache M.D.   On: 02/20/2017 14:12   Ct Head Wo Contrast  Result Date: 02/20/2017 CLINICAL DATA:  66 year old male with history of high blood pressure. Severe headache for the past couple of hours. EXAM: CT HEAD WITHOUT CONTRAST TECHNIQUE: Contiguous axial images were obtained from the base of the skull through the vertex without intravenous contrast. COMPARISON:  No priors. FINDINGS: Brain: Extensive high attenuation is noted throughout the basal cisterns,  and extending inferiorly along the anterior surface of the brainstem and upper cervical spinal cord, compatible with a large volume of subarachnoid hemorrhage. This is present bilaterally, but appears more pronounced on the right side, particularly in the region of the right MCA distribution. The largest collection of blood is anterior to the brainstem. No evidence of acute infarction, hydrocephalus, extra-axial collection or mass lesion/mass effect. Patchy and confluent areas of decreased attenuation are noted throughout the deep and periventricular white matter of the cerebral hemispheres bilaterally, compatible with chronic microvascular ischemic disease. Vascular: No hyperdense vessel or unexpected calcification (many cerebral vessels are  obscured by high attenuation). Skull: Negative for fracture or focal lesion. Sinuses/Orbits: No acute finding. Other: None. IMPRESSION: 1. Large amount of subarachnoid hemorrhage, as above. Further evaluation with head CTA is strongly recommended at this time to evaluate for probable aneurysm. Critical Value/emergent results were called by telephone at the time of interpretation on 02/20/2017 at 1:33 pm to [facility] RN for Dr. Melene Plan, who verbally acknowledged these results. Electronically Signed   By: Trudie Reed M.D.   On: 02/20/2017 13:37   Ct Angio Neck W And/or Wo Contrast  Result Date: 02/20/2017 CLINICAL DATA:  Subarachnoid hemorrhage. EXAM: CT ANGIOGRAPHY HEAD AND NECK TECHNIQUE: Multidetector CT imaging of the head and neck was performed using the standard protocol during bolus administration of intravenous contrast. Multiplanar CT image reconstructions and MIPs were obtained to evaluate the vascular anatomy. Carotid stenosis measurements (when applicable) are obtained utilizing NASCET criteria, using the distal internal carotid diameter as the denominator. CONTRAST:  50 mL Isovue 370 COMPARISON:  Noncontrast head CT today FINDINGS: CTA NECK FINDINGS Aortic  arch: Standard 3 vessel aortic arch. Brachiocephalic and subclavian arteries are widely patent. Right carotid system: Patent without evidence of stenosis or dissection. Tortuous mid cervical ICA with minimal calcified plaque. Left carotid system: Patent without evidence of stenosis or dissection. Tortuous mid cervical ICA. Vertebral arteries: Patent and codominant without evidence of stenosis or dissection. Skeleton: Moderate cervical facet arthrosis. Left facet ankylosis at C2-3. Other neck: No mass or enlarged lymph nodes. Upper chest: Calcified right hilar lymph nodes. Grossly clear lung apices. Review of the MIP images confirms the above findings CTA HEAD FINDINGS Anterior circulation: The internal carotid arteries are patent from skullbase to carotid termini without evidence of stenosis. ACAs and MCAs are patent without evidence of proximal branch occlusion. There is mild diffuse vessel irregularity without evidence of flow limiting proximal stenosis. No aneurysm is identified. Posterior circulation: Intracranial vertebral arteries are patent to the basilar and codominant. Streak artifact mildly limits evaluation of the distal V4 segments. Patent PICA and SCA origins are visualized bilaterally. The basilar artery is patent without significant stenosis. ACAs are patent with asymmetric attenuation of distal right PCA branch vessels. There is an irregular, mildly attenuated appearance of the P1 segments bilaterally, more so on the left which may be artifactual or reflect underlying atherosclerosis. No aneurysm is identified. Venous sinuses: Patent. Anatomic variants: None. Delayed phase: No abnormal enhancement. Review of the MIP images confirms the above findings IMPRESSION: 1. No intracranial aneurysm identified to account for the subarachnoid hemorrhage. 2. Marked asymmetric attenuation of right PCA branch vessels. 3. Diffuse intracranial vascular irregularity may be artifactual and/or reflective of  atherosclerosis. 4. Widely patent cervical carotid and vertebral arteries. Electronically Signed   By: Sebastian Ache M.D.   On: 02/20/2017 14:12    Procedures Procedures (including critical care time)  Medications Ordered in ED Medications  0.9 %  sodium chloride infusion (not administered)  acetaminophen (TYLENOL) tablet 650 mg (not administered)    Or  acetaminophen (TYLENOL) solution 650 mg (not administered)    Or  acetaminophen (TYLENOL) suppository 650 mg (not administered)  docusate sodium (COLACE) capsule 100 mg (not administered)  ondansetron (ZOFRAN-ODT) disintegrating tablet 4 mg (not administered)    Or  ondansetron (ZOFRAN) injection 4 mg (not administered)  pantoprazole (PROTONIX) EC tablet 40 mg (not administered)    Or  pantoprazole sodium (PROTONIX) 40 mg/20 mL oral suspension 40 mg (not administered)  acetaminophen-codeine (TYLENOL #3) 300-30 MG per tablet 1-2 tablet (  not administered)  levETIRAcetam (KEPPRA) 500 mg in sodium chloride 0.9 % 100 mL IVPB (not administered)  labetalol (NORMODYNE,TRANDATE) injection 20 mg (not administered)    And  clevidipine (CLEVIPREX) infusion 0.5 mg/mL (not administered)  labetalol (NORMODYNE,TRANDATE) injection 10-40 mg (not administered)  dexamethasone (DECADRON) injection 10 mg (not administered)  dexamethasone (DECADRON) injection 4 mg (not administered)  HYDROmorphone (DILAUDID) injection 1 mg (1 mg Intravenous Given 02/20/17 1351)  ondansetron (ZOFRAN) injection 4 mg (4 mg Intravenous Given 02/20/17 1351)  iopamidol (ISOVUE-370) 76 % injection (50 mLs  Contrast Given 02/20/17 1324)  nicardipine (CARDENE) 20mg  in 0.86% saline IV infusion (0.1 mg/ml) (15 mg/hr Intravenous Rate/Dose Change 02/20/17 1455)     Initial Impression / Assessment and Plan / ED Course  I have reviewed the triage vital signs and the nursing notes.  Pertinent labs & imaging results that were available during my care of the patient were reviewed by  me and considered in my medical decision making (see chart for details).     66 yo M with acute onset headache.  Patient in distress on initial eval, diaphoretic in severe pain.  Concern for [facility].  Taken urgently to CT, confirmed, had CTA immediately following. Started on cardene gtt for HTN.  Discussed with neurosurgery.   CRITICAL CARE Performed by: Rae Roam   Total critical care time: 80 minutes  Critical care time was exclusive of separately billable procedures and treating other patients.  Critical care was necessary to treat or prevent imminent or life-threatening deterioration.  Critical care was time spent personally by me on the following activities: development of treatment plan with patient and/or surrogate as well as nursing, discussions with consultants, evaluation of patient's response to treatment, examination of patient, obtaining history from patient or surrogate, ordering and performing treatments and interventions, ordering and review of laboratory studies, ordering and review of radiographic studies, pulse oximetry and re-evaluation of patient's condition.  The patients results and plan were reviewed and discussed.   Any x-rays performed were independently reviewed by myself.   Differential diagnosis were considered with the presenting HPI.  Medications  0.9 %  sodium chloride infusion ( Intravenous New Bag/Given 02/20/17 1536)  acetaminophen (TYLENOL) tablet 650 mg (not administered)    Or  acetaminophen (TYLENOL) solution 650 mg (not administered)    Or  acetaminophen (TYLENOL) suppository 650 mg (not administered)  docusate sodium (COLACE) capsule 100 mg (not administered)  ondansetron (ZOFRAN-ODT) disintegrating tablet 4 mg (not administered)    Or  ondansetron (ZOFRAN) injection 4 mg (not administered)  pantoprazole (PROTONIX) EC tablet 40 mg (not administered)    Or  pantoprazole sodium (PROTONIX) 40 mg/20 mL oral suspension 40 mg (not  administered)  acetaminophen-codeine (TYLENOL #3) 300-30 MG per tablet 1-2 tablet (not administered)  levETIRAcetam (KEPPRA) 500 mg in sodium chloride 0.9 % 100 mL IVPB (500 mg Intravenous New Bag/Given 02/20/17 1536)  labetalol (NORMODYNE,TRANDATE) injection 20 mg (0 mg Intravenous Hold 02/20/17 1639)    And  clevidipine (CLEVIPREX) infusion 0.5 mg/mL (7 mg/hr Intravenous New Bag/Given 02/20/17 1832)  labetalol (NORMODYNE,TRANDATE) injection 10-40 mg (not administered)  dexamethasone (DECADRON) injection 4 mg (not administered)  niCARdipine in saline (CARDENE-IV) 20-0.86 MG/200ML-% infusion SOLN (not administered)  fentaNYL (SUBLIMAZE) 100 MCG/2ML injection (not administered)  midazolam (VERSED) 2 MG/2ML injection (not administered)  lidocaine (XYLOCAINE) 1 % (with pres) injection (not administered)  LORazepam (ATIVAN) injection 1-2 mg (not administered)  folic acid (FOLVITE) tablet 1 mg (not administered)  thiamine (VITAMIN B-1)  tablet 100 mg (not administered)  multivitamin with minerals tablet 1 tablet (not administered)  HYDROmorphone (DILAUDID) injection 1 mg (1 mg Intravenous Given 02/20/17 1351)  ondansetron (ZOFRAN) injection 4 mg (4 mg Intravenous Given 02/20/17 1351)  iopamidol (ISOVUE-370) 76 % injection (50 mLs  Contrast Given 02/20/17 1324)  nicardipine (CARDENE) 20mg  in 0.86% saline IV infusion (0.1 mg/ml) (0 mg/hr Intravenous Stopped 02/20/17 1832)  dexamethasone (DECADRON) injection 10 mg (10 mg Intravenous Given 02/20/17 1540)  iopamidol (ISOVUE-300) 61 % injection (50 mLs  Contrast Given 02/20/17 1734)  midazolam (VERSED) injection (1 mg Intravenous Given 02/20/17 1649)  fentaNYL (SUBLIMAZE) injection (25 mcg Intravenous Given 02/20/17 1649)  lidocaine (XYLOCAINE) 1 % (with pres) injection (10 mLs Infiltration Given 02/20/17 1649)  ceFAZolin (ANCEF) IVPB 2g/100 mL premix (2 g Intravenous New Bag/Given 02/20/17 1657)  iopamidol (ISOVUE-300) 61 % injection (35 mLs  Contrast Given  02/20/17 1734)    Vitals:   02/20/17 1845 02/20/17 1900 02/20/17 1915 02/20/17 1930  BP: 140/60 129/61 140/66 130/70  Pulse: 64 63 66 62  Resp: 17 17 14 15   Temp:      TempSrc:      SpO2: 91% (!) 89% 91% 97%  Weight:      Height:        Final diagnoses:  SAH (subarachnoid hemorrhage) (HCC)    Admission/ observation were discussed with the admitting physician, patient and/or family and they are comfortable with the plan.   Final Clinical Impressions(s) / ED Diagnoses   Final diagnoses:  SAH (subarachnoid hemorrhage) (HCC)    New Prescriptions New Prescriptions   No medications on file     Melene Plan, DO 02/20/17 2013

## 2017-02-20 NOTE — ED Notes (Signed)
Report given.

## 2017-02-20 NOTE — Sedation Documentation (Signed)
Pressure hold to R groin complete

## 2017-02-20 NOTE — ED Triage Notes (Signed)
Patient experienced a sharp intense headache earlier today with minimal exertion. He is extremely diaphoretic and CO of pain in his neck and head still. multiple PVCs on EKG

## 2017-02-20 NOTE — ED Notes (Signed)
Taken to IR

## 2017-02-20 NOTE — ED Notes (Signed)
Report attempted 

## 2017-02-20 NOTE — Progress Notes (Signed)
Pts seen and examined. Pt presented with sudden onset severe HA/neck stiffness. No LOC. Denies visual changes, or N/T/W. Does c/o photophobia. No significant medical hx x hyperchol, non-smoker, no FHx of aneurysms.  EXAM:  BP 139/63   Pulse 61   Resp 13   SpO2 98%   Awake, alert, oriented  Speech fluent, appropriate  CN grossly intact  5/5 BUE/BLE   CT/CTA Diffuse basal/prepontine SAH. No HCP. No aneurysm seen on CTA.  IMPRESSION:  66 y.o. male Hunt-H[place] 2 SAH with negative CTA.  PLAN: - Will proceed with diagnostic cerebral angiogram.  I spoke at length with the patient and his family regarding the imaging findings thus far. I explained to them that intracranial aneurysm was the most common non-traumatic cause for [facility] and that the definitive diagnosis is made by diagnostic angiogram. The risks of the angiogram were also reviewed to include stroke and bleeding, arterial dissection, contrast reaction, nephropathy, and hematoma.   The patient and his family understood our discussion and the provided consent to proceed with diagnostic angiogram. All questions were answered.

## 2017-02-20 NOTE — ED Notes (Signed)
Patient actively vomiting  MD at bedside

## 2017-02-21 ENCOUNTER — Inpatient Hospital Stay (HOSPITAL_COMMUNITY): Payer: Medicare Other

## 2017-02-21 ENCOUNTER — Encounter (HOSPITAL_COMMUNITY): Payer: Self-pay

## 2017-02-21 DIAGNOSIS — I609 Nontraumatic subarachnoid hemorrhage, unspecified: Secondary | ICD-10-CM

## 2017-02-21 LAB — RAPID URINE DRUG SCREEN, HOSP PERFORMED
Amphetamines: NOT DETECTED
Barbiturates: NOT DETECTED
Benzodiazepines: POSITIVE — AB
Cocaine: NOT DETECTED
Opiates: POSITIVE — AB
Tetrahydrocannabinol: NOT DETECTED

## 2017-02-21 MED ORDER — ORAL CARE MOUTH RINSE
15.0000 mL | Freq: Two times a day (BID) | OROMUCOSAL | Status: DC
  Administered 2017-02-21: 15 mL via OROMUCOSAL

## 2017-02-21 MED ORDER — MELOXICAM 7.5 MG PO TABS
15.0000 mg | ORAL_TABLET | Freq: Every day | ORAL | Status: DC
  Administered 2017-02-21 – 2017-02-23 (×3): 15 mg via ORAL
  Filled 2017-02-21: qty 2
  Filled 2017-02-21 (×2): qty 1
  Filled 2017-02-21 (×4): qty 2

## 2017-02-21 MED ORDER — ROSUVASTATIN CALCIUM 10 MG PO TABS
10.0000 mg | ORAL_TABLET | Freq: Every day | ORAL | Status: DC
  Administered 2017-02-21 – 2017-02-27 (×7): 10 mg via ORAL
  Filled 2017-02-21 (×7): qty 1

## 2017-02-21 NOTE — Evaluation (Signed)
Physical Therapy Evaluation Patient Details Name: Brad Velez MRN: [id number] DOB: [birth year] Today's Date: 02/21/2017   History of Present Illness  Pt is a 66 y.o. male admitted to ED on 02/20/17 with headache and neck stiffness; CT shows SAH with highest concentration of blood at basal cisterns and anterior to brainstem and upper cervical cord, new small intraventricular hemorrhage within sulci of parietal lobes and R frontal lobe. Cerebral angio performed on 4/16 showed no aneurysm, fistulas, or vasospasm. Sheath pulled 4/16. Pertinent PMH includes sleep apnea, HTN, hiatal hernia, arthritis.   Clinical Impression  Pt presents to PT with impaired balance and an overall decrease in functional mobility secondary to above. 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 Wife will be available for 24-7 supervision for ~1 wk after d/c. Today, pt amb 200' in hallway with min guard for balance, progressing to supervision. Increased lateral sway and slowed speed with head turns and stepping over objects. Ascended/descended 3 steps with min guard and hand rail. DGI score 20/24 indicates decreased risk for falls. Pt would benefit from continued acute PT services to maximize functional mobility and independence prior to d/c home.    Follow Up Recommendations No PT follow up;Supervision for mobility/OOB    Equipment Recommendations  None recommended by PT    Recommendations for [facility] OT consult     Precautions / Restrictions Precautions Precautions: Fall Restrictions Weight Bearing Restrictions: No      Mobility  Bed Mobility Overal bed mobility: Needs Assistance Bed Mobility: Supine to Sit     Supine to sit: Supervision     General bed mobility comments: up in chair  Transfers Overall transfer level: Needs assistance Equipment used: None Transfers: Sit to/from Stand;Stand Pivot Transfers Sit to Stand: Modified independent (Device/Increase time) Stand pivot  transfers: Modified independent (Device/Increase time)          Ambulation/Gait Ambulation/Gait assistance: Supervision Ambulation Distance (Feet): 200 Feet Assistive device: None Gait Pattern/deviations: Step-through pattern Gait velocity: Decreased   General Gait Details: Amb with min guard for balance, progressing to supervision.  Stairs Stairs: Yes Stairs assistance: Min guard Stair Management: Alternating pattern;One rail Right;No rails Number of Stairs: 3 General stair comments: Ascend 3 steps with no rail and descend with R hand rail; min guard for balance  Wheelchair Mobility    Modified Rankin (Stroke Patients Only) Modified Rankin (Stroke Patients Only) Pre-Morbid Rankin Score: No symptoms Modified Rankin: Slight disability     Balance Overall balance assessment: Needs assistance Sitting-balance support: No upper extremity supported;Feet supported Sitting balance-Leahy Scale: Good Sitting balance - Comments: Donned boxers sitting EOB   Standing balance support: No upper extremity supported Standing balance-Leahy Scale: Good               High level balance activites: Backward walking;Direction changes;Turns;Sudden stops;Head turns High Level Balance Comments: Decreased speed and increased lateral sway with head turns and stepping over object; no LOB. Standardized Balance Assessment Standardized Balance Assessment : Dynamic Gait Index   Dynamic Gait Index Level Surface: Normal Change in Gait Speed: Normal Gait with Horizontal Head Turns: Mild Impairment Gait with Vertical Head Turns: Mild Impairment Gait and Pivot Turn: Normal Step Over Obstacle: Mild Impairment Step Around Obstacles: Normal Steps: Mild Impairment Total Score: 20       Pertinent Vitals/Pain Pain Assessment: 0-10 Pain Score: 1  Pain Location: Headache Pain Descriptors / Indicators: Headache Pain Intervention(s): Limited activity within patient's tolerance    Home Living  Family/patient  expects to be discharged to:: Private residence Living Arrangements: Spouse/significant Brad Available Help at Discharge: Family;Available 24 hours/day Type of Home: House Home Access: Stairs to enter Entrance Stairs-Rails: None Entrance Stairs-Number of Steps: 1 Home Layout: One level Home Equipment: Cane - single point      Prior Function Level of Independence: Independent         Comments: Pt retired from work, 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      Hand Dominance   Dominant Hand: Left    Extremity/Trunk Assessment   Upper Extremity Assessment Upper Extremity Assessment: Defer to OT evaluation    Lower Extremity Assessment Lower Extremity Assessment: Overall WFL for tasks assessed    Cervical / Trunk Assessment Cervical / Trunk Assessment: Normal  Communication   Communication: No difficulties  Cognition Arousal/Alertness: Awake/alert Behavior During Therapy: WFL for tasks assessed/performed Overall Cognitive Status: Within Functional Limits for tasks assessed                                      General Comments General comments (skin integrity, edema, etc.): VSS; wife and son present throughout session.    Exercises     Assessment/Plan    PT Assessment Patient needs continued PT services  PT Problem List Decreased balance;Decreased mobility       PT Treatment Interventions Gait training;Stair training;Functional mobility training;Therapeutic activities;Therapeutic exercise;Balance training;Patient/family education    PT Goals (Current goals can be found in the Care Plan section)  Acute Rehab PT Goals Patient Stated Goal: Return home PT Goal Formulation: With patient Time For Goal Achievement: 03/07/17 Potential to Achieve Goals: Good    Frequency Min 4X/week   Barriers to discharge        Co-evaluation               End of Session Equipment Utilized During Treatment: Gait  belt Activity Tolerance: Patient tolerated treatment well Patient left: in chair;with call bell/phone within reach;with family/visitor present;with chair alarm set Nurse Communication: Mobility status PT Visit Diagnosis: Unsteadiness on feet (R26.81)    Time: 0950-1009 PT Time Calculation (min) (ACUTE ONLY): 19 min   Charges:   PT Evaluation $PT Eval Low Complexity: 1 Procedure     PT G Codes:       Brad Velez, SPT Office-[phone]  Brad Velez 02/21/2017, 1:03 PM

## 2017-02-21 NOTE — Progress Notes (Signed)
Transcranial Doppler  Date POD PCO2 HCT BP  MCA ACA PCA OPHT SIPH VERT Basilar  02/21/17 MS     Right  Left   31  12.7   *  *   *  *   31  39   36  27   -25  *   *           Right  Left                                            Right  Left                                             Right  Left                                             Right  Left                                            Right  Left                                            Right  Left                                        MCA = Middle Cerebral Artery      OPHT = Opthalmic Artery     BASILAR = Basilar Artery   ACA = Anterior Cerebral Artery     SIPH = Carotid Siphon PCA = Posterior Cerebral Artery   VERT = Verterbral Artery                   Normal MCA = 62+\-12 ACA = 50+\-12 PCA = 42+\-23   02/21/17-*Unable to insonate due to poor acoustic windows. Right Lindegaard ratio=1.1, left Lindegaard ratio=0.5.  02/21/2017 10:29 AM Gertie Fey, BS, RVT, RDCS, RDMS

## 2017-02-21 NOTE — Evaluation (Signed)
Occupational Therapy Evaluation Patient Details Name: Brad Velez MRN: [id number] DOB: 11/12/1950 Today's Date: 02/21/2017    History of Present Illness Pt is a 66 y.o. male admitted to ED on 02/20/17 with headache and neck stiffness; CT shows SAH with highest concentration of blood at basal cisterns and anterior to brainstem and upper cervical cord, new small intraventricular hemorrhage within sulci of parietal lobes and R frontal lobe. Cerebral angio performed on 4/16 showed no aneurysm, fistulas, or vasospasm. Sheath pulled 4/16. Pertinent PMH includes sleep apnea, HTN, hiatal hernia, arthritis.    Clinical Impression   Patient evaluated by Occupational Therapy with no further acute OT needs identified. All education has been completed and the patient has no further questions. Pt appears to be at/or close to his baseline, and is independent with ADLs.  All education completed.  See below for any follow-up Occupational Therapy or equipment needs. OT is signing off. Thank you for this referral.      Follow Up Recommendations  No OT follow up;Supervision - Intermittent    Equipment Recommendations  None recommended by OT    Recommendations for Other Services       Precautions / Restrictions Precautions Precautions: Fall Restrictions Weight Bearing Restrictions: No      Mobility Bed Mobility Overal bed mobility: Needs Assistance Bed Mobility: Supine to Sit     Supine to sit: Supervision     General bed mobility comments: up in chair  Transfers Overall transfer level: Needs assistance Equipment used: None Transfers: Sit to/from Stand;Stand Pivot Transfers Sit to Stand: Modified independent (Device/Increase time) Stand pivot transfers: Modified independent (Device/Increase time)            Balance Overall balance assessment: Needs assistance Sitting-balance support: No upper extremity supported;Feet supported Sitting balance-Leahy Scale: Good Sitting balance -  Comments: Donned boxers sitting EOB   Standing balance support: No upper extremity supported Standing balance-Leahy Scale: Good                   Standardized Balance Assessment Standardized Balance Assessment : Dynamic Gait Index   Dynamic Gait Index Level Surface: Normal Change in Gait Speed: Normal Gait with Horizontal Head Turns: Mild Impairment Gait with Vertical Head Turns: Mild Impairment Gait and Pivot Turn: Normal Step Over Obstacle: Mild Impairment Step Around Obstacles: Normal Steps: Mild Impairment Total Score: 20     ADL either performed or assessed with clinical judgement   ADL Overall ADL's : Modified independent                                             Vision Baseline Vision/History: No visual deficits (contacts ) Patient Visual Report: No change from baseline Vision Assessment?: Yes Eye Alignment: Within Functional Limits Ocular Range of Motion: Within Functional Limits Alignment/Gaze Preference: Within Defined Limits Tracking/Visual Pursuits: Able to track stimulus in all quads without difficulty Saccades: Within functional limits Convergence: Within functional limits Visual Fields: No apparent deficits Additional Comments: Pt able to read info on phone without difficulty      Perception Perception Perception Tested?: Yes   Praxis Praxis Praxis tested?: Within functional limits    Pertinent Vitals/Pain Pain Assessment: 0-10 Pain Score: 1  Pain Location: Headache Pain Descriptors / Indicators: Headache Pain Intervention(s): Limited activity within patient's tolerance     Hand Dominance Left   Extremity/Trunk Assessment Upper Extremity Assessment Upper Extremity  Assessment: Overall WFL for tasks assessed   Lower Extremity Assessment Lower Extremity Assessment: Defer to PT evaluation   Cervical / Trunk Assessment Cervical / Trunk Assessment: Normal   Communication Communication Communication: No  difficulties   Cognition Arousal/Alertness: Awake/alert Behavior During Therapy: WFL for tasks assessed/performed Overall Cognitive Status: Within Functional Limits for tasks assessed                                 General Comments: Pt able to complete serial subtraction from 100 by 7s with one error.  He was able to generate >12 words that start with letter F in less than a minute, while walking, and was able to perform the executive function subtest of the [facility] without difficutly/error.  He recalls all info from this am, and wife reports no changes in behavior.      General Comments  VSS.  wife present.  Discussed need to take his time and move slowly initially, as well as possibility of post stroke fatigue as he returns to his daily acitivities     Exercises     Shoulder Instructions      Home Living Family/patient expects to be discharged to:: Private residence Living Arrangements: Spouse/significant other Available Help at Discharge: Family;Available 24 hours/day Type of Home: House Home Access: Stairs to enter [employer] of Steps: 1 Entrance Stairs-Rails: None Home Layout: One level     Bathroom Shower/Tub: Producer, television/film/video: Standard Bathroom Accessibility: Yes   Home Equipment: Cane - single point          Prior Functioning/Environment Level of Independence: Independent        Comments: Pt retired from work, 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         OT Problem List: Decreased strength      OT Treatment/Interventions:      OT Goals(Current goals can be found in the care plan section) Acute Rehab OT Goals Patient Stated Goal: Return home OT Goal Formulation: All assessment and education complete, DC therapy  OT Frequency:     Barriers to D/C:            Co-evaluation              End of Session Nurse Communication: Mobility status  Activity Tolerance: Patient tolerated treatment  well Patient left: in chair;with call bell/phone within reach;with family/visitor present  OT Visit Diagnosis: Unsteadiness on feet (R26.81)                Time: 0865-7846 OT Time Calculation (min): 35 min Charges:  OT General Charges $OT Visit: 1 Procedure OT Evaluation $OT Eval Low Complexity: 1 Procedure OT Treatments $Therapeutic Activity: 8-22 mins G-Codes:     [employer], OTR/L [phone]   Jeani Hawking M 02/21/2017, 12:43 PM

## 2017-02-21 NOTE — Progress Notes (Signed)
Pt seen and examined.  No issues overnight. Headache has resolved Still having some neck tightness  EXAM: Temp:  [97.6 F (36.4 C)-98.1 F (36.7 C)] 97.6 F (36.4 C) (04/17 1200) Pulse Rate:  [58-141] 67 (04/17 1200) Resp:  [0-24] 16 (04/17 1200) BP: (97-175)/(52-116) 155/72 (04/17 1200) SpO2:  [89 %-100 %] 95 % (04/17 1200) Weight:  [120.4 kg (265 lb 6.9 oz)] 120.4 kg (265 lb 6.9 oz) (04/16 1811) Intake/Output      04/16 0701 - 04/17 0700 04/17 0701 - 04/18 0700   P.O. 300    I.V. (mL/kg) 1957.6 (16.3) 224 (1.9)   IV Piggyback 210    Total Intake(mL/kg) 2467.6 (20.5) 224 (1.9)   Urine (mL/kg/hr) 650    Total Output 650     Net +1817.6 +224         Awake and alert Follows commands throughout Full strength  Repeat head CT looks good Will continue current care Continue to work with PT/OT Discussed with Dr Conchita Paris - SBP goal <160

## 2017-02-21 NOTE — Progress Notes (Signed)
OT Cancellation Note  Patient Details Name: Brad Velez MRN: [id number] DOB: 05-Mar-1951   Cancelled Treatment:    Reason Eval/Treat Not Completed: Patient not medically ready (strict bedrest at this time)  Harolyn Rutherford  [phone] 02/21/2017, 7:16 AM

## 2017-02-21 NOTE — Care Management Note (Signed)
Case Management Note  Patient Details  Name: Brad Velez MRN: [id number] Date of Birth: 20-Mar-1951  Subjective/Objective:  Pt admitted on 02/20/17 with SAH, small IVH.  PTA, pt independent, lives with spouse.                    Action/Plan: PT/OT currently recommending no OP follow up.  Will follow for discharge needs as pt progresses.    Expected Discharge Date:                  Expected Discharge Plan:  Home/Self Care  In-House Referral:     Discharge planning Services  CM Consult  Post Acute Care Choice:    Choice offered to:     DME Arranged:    DME Agency:     HH Arranged:    HH Agency:     Status of Service:  In process, will continue to follow  If discussed at Long Length of Stay Meetings, dates discussed:    Additional Comments:  Quintella Baton, RN, BSN  Trauma/Neuro ICU Case Manager [phone]

## 2017-02-22 ENCOUNTER — Inpatient Hospital Stay (HOSPITAL_COMMUNITY): Payer: Medicare Other

## 2017-02-22 DIAGNOSIS — I609 Nontraumatic subarachnoid hemorrhage, unspecified: Secondary | ICD-10-CM

## 2017-02-22 LAB — TRIGLYCERIDES: Triglycerides: 44 mg/dL (ref <150)

## 2017-02-22 MED ORDER — DEXAMETHASONE 4 MG PO TABS
4.0000 mg | ORAL_TABLET | Freq: Four times a day (QID) | ORAL | Status: DC
  Administered 2017-02-22 – 2017-02-25 (×12): 4 mg via ORAL
  Filled 2017-02-22 (×12): qty 1

## 2017-02-22 MED ORDER — SENNA 8.6 MG PO TABS
1.0000 | ORAL_TABLET | Freq: Every day | ORAL | Status: DC | PRN
  Administered 2017-02-22 – 2017-02-25 (×2): 8.6 mg via ORAL
  Filled 2017-02-22 (×2): qty 1

## 2017-02-22 MED ORDER — HYDRALAZINE HCL 20 MG/ML IJ SOLN
5.0000 mg | INTRAMUSCULAR | Status: DC | PRN
  Administered 2017-02-22: 5 mg via INTRAVENOUS
  Administered 2017-02-22: 10 mg via INTRAVENOUS
  Administered 2017-02-23 (×2): 20 mg via INTRAVENOUS
  Administered 2017-02-23: 10 mg via INTRAVENOUS
  Administered 2017-02-23: 20 mg via INTRAVENOUS
  Administered 2017-02-23: 15 mg via INTRAVENOUS
  Administered 2017-02-23 – 2017-02-27 (×11): 20 mg via INTRAVENOUS
  Filled 2017-02-22 (×17): qty 1

## 2017-02-22 MED ORDER — HYDROCODONE-ACETAMINOPHEN 5-325 MG PO TABS
1.0000 | ORAL_TABLET | ORAL | Status: DC | PRN
  Administered 2017-02-22 – 2017-02-27 (×13): 2 via ORAL
  Filled 2017-02-22 (×13): qty 2

## 2017-02-22 NOTE — Progress Notes (Signed)
  Speech Language Pathology  Patient Details Name: Brad Velez MRN: [id number] DOB: 1951-02-28 Today's Date: 02/22/2017 Time:  -     Pt denied problems with speech,language or cognition since this CVA. SLP did not note apparent need for formal assessment. He reported some occasional word finding difficulty prior to this CVA. SLP educated on strategies for dysnomia.  No ST needed.               Royce Macadamia 02/22/2017, 2:23 PM  Breck Coons Lonell Face.Ed [employer] [phone]

## 2017-02-22 NOTE — Progress Notes (Signed)
Pt seen and examined. No issues overnight. Has some neck stiffness, otherwise no complaints.  EXAM: Temp:  [97.6 F (36.4 C)-98.6 F (37 C)] 98.6 F (37 C) (04/18 0743) Pulse Rate:  [48-139] 63 (04/18 1000) Resp:  [11-31] 17 (04/18 1000) BP: (119-169)/(51-116) 137/60 (04/18 1000) SpO2:  [90 %-100 %] 95 % (04/18 1000) Intake/Output      04/17 0701 - 04/18 0700 04/18 0701 - 04/19 0700   P.O.  940   I.V. (mL/kg) 2424 (20.1) 300 (2.5)   IV Piggyback 210    Total Intake(mL/kg) 2634 (21.9) 1240 (10.3)   Urine (mL/kg/hr) 1950 (0.7)    Total Output 1950     Net +684 +1240         Awake, alert, oriented Speech fluent CN intact Good strength  LABS: Lab Results  Component Value Date   CREATININE 0.70 02/20/2017   BUN 21 (H) 02/20/2017   NA 139 02/20/2017   K 3.7 02/20/2017   CL 105 02/20/2017   CO2 22 02/20/2017   Lab Results  Component Value Date   WBC 9.3 02/20/2017   HGB 16.4 02/20/2017   HCT 47.7 02/20/2017   MCV 96.6 02/20/2017   PLT 192 02/20/2017    IMPRESSION: - 66 y.o. male SAH d# 3, angio negative. Doing well  PLAN: - Cont observation in ICU - Can mobilize as tolerated - Will add Norco for pain

## 2017-02-22 NOTE — Progress Notes (Signed)
After repositioning pt to the chair, BP reading greater than 160 systolic for over 1 hour. Notified on-call neurosurgeon of current pt situation and received PRN hydralazine order 5-20 mg every 1 hour because pt's HR maintaining at or below 50 BPM and current labetalol order unsafe to administer.   Will continue to monitor.  Francia Greaves, RN

## 2017-02-22 NOTE — Progress Notes (Signed)
Physical Therapy Treatment Patient Details Name: Brad Velez MRN: [id number] DOB: 01/13/1951 Today's Date: 02/22/2017    History of Present Illness Pt is a 66 y.o. male admitted to ED on 02/20/17 with headache and neck stiffness; CT shows SAH with highest concentration of blood at basal cisterns and anterior to brainstem and upper cervical cord, new small intraventricular hemorrhage within sulci of parietal lobes and R frontal lobe. Cerebral angio performed on 4/16 showed no aneurysm, fistulas, or vasospasm. Sheath pulled 4/16. Pertinent PMH includes sleep apnea, HTN, hiatal hernia, arthritis.    PT Comments    Pt progressing well with mobility, scoring 22/24 on DGI today indicating decrease risk for falls and safe community ambulator. Indep with all transfers and amb in hallway. Pt educ on CVA signs/symptoms and importance of mobility; pt with no concerns or questions about returning home. Pt has met all short-term PT goals, and has no acute PT needs at this time. D/C PT.    Follow Up Recommendations  No PT follow up;Supervision for mobility/OOB     Equipment Recommendations  None recommended by PT    Recommendations for Other Services       Precautions / Restrictions Precautions Precautions: Fall Restrictions Weight Bearing Restrictions: No    Mobility  Bed Mobility Overal bed mobility: Independent                Transfers Overall transfer level: Independent Equipment used: None Transfers: Sit to/from Stand              Ambulation/Gait Ambulation/Gait assistance: Independent Ambulation Distance (Feet): 600 Feet Assistive device: None Gait Pattern/deviations: WFL(Within Functional Limits)   Gait velocity interpretation: at or above normal speed for age/gender     Stairs            Wheelchair Mobility    Modified Rankin (Stroke Patients Only) Modified Rankin (Stroke Patients Only) Pre-Morbid Rankin Score: No symptoms Modified Rankin: No  significant disability     Balance Overall balance assessment: Independent                           High level balance activites: Side stepping;Braiding;Backward walking;Direction changes;Turns;Sudden stops;Head turns High Level Balance Comments: Increased lateral sway with first time stepping over object (which pt attributed to socks), pt able to self-correct with no LOB. Standardized Balance Assessment Standardized Balance Assessment : Dynamic Gait Index   Dynamic Gait Index Level Surface: Normal Change in Gait Speed: Normal Gait with Horizontal Head Turns: Normal Gait with Vertical Head Turns: Normal Gait and Pivot Turn: Normal Step Over Obstacle: Mild Impairment Step Around Obstacles: Normal Steps: Mild Impairment Total Score: 22      Cognition Arousal/Alertness: Awake/alert Behavior During Therapy: WFL for tasks assessed/performed Overall Cognitive Status: Within Functional Limits for tasks assessed                                        Exercises      General Comments General comments (skin integrity, edema, etc.): Educ on CVA symptoms      Pertinent Vitals/Pain Pain Assessment: No/denies pain    Home Living                      Prior Function            PT Goals (current goals can now be found in  the care plan section) Acute Rehab PT Goals Patient Stated Goal: Return home PT Goal Formulation: With patient Time For Goal Achievement: 03/07/17 Potential to Achieve Goals: Good Progress towards PT goals: Goals met/education completed, patient discharged from PT    Frequency           PT Plan Current plan remains appropriate    Co-evaluation             End of Session Equipment Utilized During Treatment: Gait belt Activity Tolerance: Patient tolerated treatment well Patient left: in bed;with call bell/phone within reach;with family/visitor present Nurse Communication: Mobility status PT Visit Diagnosis:  Unsteadiness on feet (R26.81)     Time: 1025-1040 PT Time Calculation (min) (ACUTE ONLY): 15 min  Charges:  $Gait Training: 8-22 mins                    G Codes:      Enis Gash, SPT Office-[phone]  Mabeline Caras 02/22/2017, 12:46 PM

## 2017-02-22 NOTE — Progress Notes (Signed)
Transcranial Doppler  Date POD PCO2 HCT BP  MCA ACA PCA OPHT SIPH VERT Basilar  02/21/17 MS     Right  Left   31  12.7   *  *   *  *   31  39   36  27   -25  *   *      4/18MS     Right  Left   44  50   -25  *   *  33  48   -26  -26   *           Right  Left                                             Right  Left                                             Right  Left                                            Right  Left                                            Right  Left                                        MCA = Middle Cerebral Artery      OPHT = Opthalmic Artery     BASILAR = Basilar Artery   ACA = Anterior Cerebral Artery     SIPH = Carotid Siphon PCA = Posterior Cerebral Artery   VERT = Verterbral Artery                   Normal MCA = 62+\-12 ACA = 50+\-12 PCA = 42+\-23   02/21/17-*Unable to insonate due to poor acoustic windows. Right Lindegaard ratio=1.1, left Lindegaard ratio=0.5. 02/22/17- *Unable to insonate due to poor acoustic windows. Lindegaard ratio: Right=1.5, left=1.9  02/22/2017 12:22 PM Gertie Fey, BS, RVT, RDCS, RDMS

## 2017-02-23 ENCOUNTER — Encounter (HOSPITAL_COMMUNITY): Payer: Self-pay | Admitting: *Deleted

## 2017-02-23 ENCOUNTER — Inpatient Hospital Stay (HOSPITAL_COMMUNITY): Payer: Medicare Other

## 2017-02-23 DIAGNOSIS — G4730 Sleep apnea, unspecified: Secondary | ICD-10-CM

## 2017-02-23 DIAGNOSIS — E785 Hyperlipidemia, unspecified: Secondary | ICD-10-CM

## 2017-02-23 DIAGNOSIS — I493 Ventricular premature depolarization: Secondary | ICD-10-CM

## 2017-02-23 DIAGNOSIS — I119 Hypertensive heart disease without heart failure: Secondary | ICD-10-CM

## 2017-02-23 DIAGNOSIS — E669 Obesity, unspecified: Secondary | ICD-10-CM

## 2017-02-23 HISTORY — DX: Obesity, unspecified: E66.9

## 2017-02-23 HISTORY — DX: Hyperlipidemia, unspecified: E78.5

## 2017-02-23 HISTORY — DX: Ventricular premature depolarization: I49.3

## 2017-02-23 LAB — BASIC METABOLIC PANEL
ANION GAP: 7 (ref 5–15)
BUN: 16 mg/dL (ref 6–20)
CHLORIDE: 102 mmol/L (ref 101–111)
CO2: 24 mmol/L (ref 22–32)
Calcium: 8.2 mg/dL — ABNORMAL LOW (ref 8.9–10.3)
Creatinine, Ser: 0.72 mg/dL (ref 0.61–1.24)
GFR calc Af Amer: >60 mL/min (ref >60)
GLUCOSE: 119 mg/dL — AB (ref 65–99)
POTASSIUM: 4.1 mmol/L (ref 3.5–5.1)
Sodium: 133 mmol/L — ABNORMAL LOW (ref 135–145)

## 2017-02-23 LAB — CBC
HCT: 43.9 % (ref 39.0–52.0)
HEMOGLOBIN: 14.7 g/dL (ref 13.0–17.0)
MCH: 32.2 pg (ref 26.0–34.0)
MCHC: 33.5 g/dL (ref 30.0–36.0)
MCV: 96.3 fL (ref 78.0–100.0)
Platelets: 198 10*3/uL (ref 150–400)
RBC: 4.56 MIL/uL (ref 4.22–5.81)
RDW: 14.8 % (ref 11.5–15.5)
WBC: 13.1 10*3/uL — AB (ref 4.0–10.5)

## 2017-02-23 MED ORDER — MORPHINE SULFATE (PF) 2 MG/ML IV SOLN
2.0000 mg | INTRAVENOUS | Status: DC | PRN
  Administered 2017-02-23 – 2017-02-25 (×4): 2 mg via INTRAVENOUS
  Filled 2017-02-23 (×4): qty 1

## 2017-02-23 MED ORDER — MORPHINE SULFATE (PF) 2 MG/ML IV SOLN
2.0000 mg | Freq: Once | INTRAVENOUS | Status: AC
  Administered 2017-02-23: 2 mg via INTRAVENOUS

## 2017-02-23 MED ORDER — MORPHINE SULFATE (PF) 2 MG/ML IV SOLN
INTRAVENOUS | Status: AC
  Filled 2017-02-23: qty 1

## 2017-02-23 NOTE — Consult Note (Signed)
Cardiology Consult Note  Admit date: 02/20/2017 Name: Brad Velez 66 y.o.  male DOB:  [birth year] MRN:  [id number]  Today's date:  02/23/2017  Referring Physician:    Neurosurgery  Primary Physician:    Dr. Synetta Fail  Reason for Consultation:    PVCs  IMPRESSIONS: 1.  Asymptomatic PVCs, couplets and nonsustained ventricular tachycardia with previous hypertensive heart disease currently asymptomatic-no further treatment needed 2.  Hypertensive heart disease blood pressure above goal currently 3.  Subarachnoid hemorrhage 4.  Sleep apnea 5.  Hyperlipidemia 6.  Obesity  RECOMMENDATION: Obtain echocardiogram.  Blood pressure control per protocol for Subarachnoid hemorrhage.  He will best be served if he comes off NSAIDs that may be affecting blood pressure control as an outpatient.  No further treatment needed for PVCs.  HISTORY: This 66 year old male presented with a subarachnoid hemorrhage that started 2 days ago.  He was not felt to have an aneurysm and is currently being managed with blood pressure control and observation.  He was noted to have PVCs and couplets and some nonsustained runs less than 4 beats of ventricular tachycardia and cardiology was asked to comment.  The patient is asymptomatic from a cardiac viewpoint.  He doesn't have any chest pain or shortness of breath.  He has a prior history of sleep apnea but does not currently worse EPAP.  He is obese.  According to the family he drinks a fair bit of alcohol on a regular basis.  He was evaluated for PVCs after he moved up here in 2008.  A Holter monitor previously done by physician in [place] showed PVCs with up to 4 beats of V. tach noted.  He was evaluated with a myocardial perfusion scan and that reportedly was negative for ischemia and also had an echocardiogram showing preserved LV function with mild LVH.  No treatment was deemed necessary for it.  He currently complained of some nausea and was vomiting during the  examination.  Blood pressure has been high and he is currently being treated with intravenous blood pressure medicines including labetalol and hydralazine and clevidipine.  Past Medical History:  Diagnosis Date  . H/O hiatal hernia   . Hyperlipidemia 02/23/2017  . Hypertension    hx of but off of medication for 5 yrs  . Obesity (BMI 30-39.9) 02/23/2017  . Pneumonia    1- 2 yrs ago  . Premature ventricular contractions (PVCs) (VPCs) 02/23/2017  . SAH (subarachnoid hemorrhage) (HCC) 02/20/2017  . Sleep apnea    doesnt use CPAP- tested 15 yrs ago     Past Surgical History:  Procedure Laterality Date  . COLONOSCOPY    . KNEE ARTHROSCOPY     right  . TONSILLECTOMY    . TOTAL KNEE ARTHROPLASTY  04/30/2012   Procedure: TOTAL KNEE ARTHROPLASTY;  Surgeon: Raymon Mutton, MD;  Location: [facility] OR;  Service: Orthopedics;  Laterality: Right;  . UPPER GASTROINTESTINAL ENDOSCOPY    . VASECTOMY      Allergies:  has No Known Allergies.   Medications: Prior to Admission medications   Medication Sig Start Date End Date Taking? Authorizing Provider  amoxicillin (AMOXIL) 500 MG capsule Take 2,000 mg by mouth See admin instructions. One hour prior to dental appointments for pre-treatment   Yes Historical Provider, MD  aspirin EC 81 MG tablet Take 81 mg by mouth at bedtime.    Yes Historical Provider, MD  cetirizine (ZYRTEC) 10 MG tablet Take 10 mg by mouth daily.   Yes Historical Provider, MD  ibuprofen (ADVIL,MOTRIN) 200 MG tablet Take 200-400 mg by mouth every 6 (six) hours as needed (for muscle soreness).   Yes Historical Provider, MD  meloxicam (MOBIC) 15 MG tablet Take 15 mg by mouth daily.   Yes Historical Provider, MD  rosuvastatin (CRESTOR) 10 MG tablet Take 10 mg by mouth daily.   Yes Historical Provider, MD    Family History: Family Status  Relation Status  . Mother Deceased  . Father Deceased  . Sister Alive  . Sister Alive  . Sister Alive    Social History:   reports that he has  never smoked. He quit smokeless tobacco use about 8 years ago. His smokeless tobacco use included Chew. He reports that he drinks about 1.5 - 2.0 oz of alcohol per week . He reports that he does not use drugs.   He moved here from [place].  He reportedly has more alcohol use than he admits to according to family.  Retired Scientist, research (physical sciences).   Review of Systems: Other than as noted above remainder of the review of systems is unremarkable.  Physical Exam: BP (!) 165/70   Pulse 75   Temp 98.6 F (37 C) (Oral)   Resp 12   Ht  (1.854 m)   Wt 120.4 kg (265 lb 6.9 oz)   SpO2 95%   BMI 35.02 kg/m  General appearance: He is an obese male who was vomiting during the examination complaining of severe headache Head: Normocephalic, without obvious abnormality, atraumatic Eyes: conjunctivae/corneas clear. PERRL, EOM's intact. Fundi not examined Neck: no adenopathy, no carotid bruit, no JVD and supple, symmetrical, trachea midline Lungs: clear to auscultation bilaterally Heart: regular rate and rhythm, S1, S2 normal, no murmur, click, rub or gallop Abdomen: soft, non-tender; bowel sounds normal; no masses,  no organomegaly Rectal: deferred Extremities: extremities normal, atraumatic, no cyanosis or edema Pulses: 2+ and symmetric Skin: Skin color, texture, turgor normal. No rashes or lesions Neurologic: Grossly normal Psych: Alert and oriented x 3 Labs: CBC  Recent Labs  02/23/17 1124  WBC 13.1*  RBC 4.56  HGB 14.7  HCT 43.9  PLT 198  MCV 96.3  MCH 32.2  MCHC 33.5  RDW 14.8   CMP   Recent Labs  02/23/17 1124  NA 133*  K 4.1  CL 102  CO2 24  GLUCOSE 119*  BUN 16  CREATININE 0.72  CALCIUM 8.2*  GFRNONAA >60  GFRAA >60     EKG: Normal sinus rhythm with PVCs, left axis deviation Independently reviewed by me  Signed:  W. Ashley Royalty MD [facility]   Cardiology Consultant  02/23/2017, 11:09 PM

## 2017-02-23 NOTE — Progress Notes (Signed)
Pt c/o worsening severe H/A, and nausea w/o emesis. Pain and anti-nausea medications administered PRN as prescribed. Pt also given cold wash cloth to place over eyes. Pt stated this was "the worse his H/A has been since the initial onset of symptoms prior to calling EMS." Neuro assessment and NIH completed in an attempt to assess for change in neuro status - both remained the same. No call made to MD at this time.   After some time, pt fell asleep and is resting comfortably.  Will continue to monitor closely.  Francia Greaves, RN

## 2017-02-23 NOTE — Progress Notes (Signed)
Received call that patient was having severe HA and nausea without vomiting. Was given prn orders with minimal relief. Pt is reportedly neurologically at baseline. Because of his hx of nightly alcohol use, symptoms could be related to w/d (has withdrawal protocol in place) but before any other meds are used, would prefer to check CT head w/o contrast due to recent [facility]

## 2017-02-23 NOTE — Progress Notes (Signed)
Pt seen and examined. No issues overnight. Had severe HA this am, CT completed. Also has had some PVC and short run of Vtach. Denies SOB, chest pain. HA improved now. No other new complaints.  EXAM: Temp:  [97.7 F (36.5 C)-98.7 F (37.1 C)] 97.7 F (36.5 C) (04/19 0803) Pulse Rate:  [44-79] 68 (04/19 1000) Resp:  [9-21] 17 (04/19 1000) BP: (97-175)/(50-89) 153/59 (04/19 1000) SpO2:  [92 %-99 %] 97 % (04/19 1000) Intake/Output      04/18 0701 - 04/19 0700 04/19 0701 - 04/20 0700   P.O. 1000    I.V. (mL/kg) 2400 (19.9) 300 (2.5)   IV Piggyback 315    Total Intake(mL/kg) 3715 (30.9) 300 (2.5)   Urine (mL/kg/hr) 750 (0.3)    Total Output 750     Net +2965 +300        Urine Occurrence 1 x    Emesis Occurrence  1 x    Awake, alert, oriented Speech fluent CN intact Good strength  LABS: Lab Results  Component Value Date   CREATININE 0.70 02/20/2017   BUN 21 (H) 02/20/2017   NA 139 02/20/2017   K 3.7 02/20/2017   CL 105 02/20/2017   CO2 22 02/20/2017   Lab Results  Component Value Date   WBC 9.3 02/20/2017   HGB 16.4 02/20/2017   HCT 47.7 02/20/2017   MCV 96.6 02/20/2017   PLT 192 02/20/2017    IMAGING: CT head reviewed, some redistribution of subarachnoid blood, no new hemorrhage. No HCP  IMPRESSION: - 66 y.o. male SAH d# 4, remains neurologically stable - Has had some PVC/V tach, hemodynamically stable. Hx of heavy alcohol use.  PLAN: - Cont close observation - will check CBC/ BMET - Start CIWA protocol - Cardiology consult

## 2017-02-24 ENCOUNTER — Inpatient Hospital Stay (HOSPITAL_COMMUNITY): Payer: Medicare Other

## 2017-02-24 DIAGNOSIS — R9431 Abnormal electrocardiogram [ECG] [EKG]: Secondary | ICD-10-CM

## 2017-02-24 LAB — GLUCOSE, CAPILLARY: GLUCOSE-CAPILLARY: 114 mg/dL — AB (ref 65–99)

## 2017-02-24 NOTE — Progress Notes (Signed)
Subjective:  His headache is somewhat better and he is no longer vomiting.  Blood pressure has come under better control overnight.  He currently denies chest pain or shortness of breath.  Objective:  Vital Signs in the last 24 hours: BP 136/72 (BP Location: Right Arm)   Pulse (!) 51   Temp 97.9 F (36.6 C) (Oral)   Resp 14   Ht  (1.854 m)   Wt 120.4 kg (265 lb 6.9 oz)   SpO2 93%   BMI 35.02 kg/m   Physical Exam:  Pleasant obese male in no acute distress  Lungs:  Clear  Cardiac:  Regular rhythm, normal S1 and S2, no S3 Extremities:  No edema present  Intake/Output from previous day: 04/19 0701 - 04/20 0700 In: 2405 [I.V.:2300; IV Piggyback:105] Out: 1025 [Urine:1025] Weight Filed Weights   02/20/17 1811  Weight: 120.4 kg (265 lb 6.9 oz)    Lab Results: Basic Metabolic Panel:  Recent Labs  84/16/60 1124  NA 133*  K 4.1  CL 102  CO2 24  GLUCOSE 119*  BUN 16  CREATININE 0.72    CBC:  Recent Labs  02/23/17 1124  WBC 13.1*  HGB 14.7  HCT 43.9  MCV 96.3  PLT 198    Telemetry: Sinus rhythm with occasional PVCs noted  Assessment/Plan:  1.  Asymptomatic PVCs 2.  Hypertensive heart disease 3.  Recent subarachnoid hemorrhage  Recommendations:  Awaiting results of echocardiogram.  No treatment is necessary for PVCs at this time.  He will need good blood pressure control with a target blood pressure of 120 systolic after discharge.  Discussed importance of lifestyle recommendations including weight loss, sodium restriction and regular exercise.     Darden Palmer  MD [facility] Cardiology  02/24/2017, 1:42 PM

## 2017-02-24 NOTE — Progress Notes (Signed)
Transcranial Doppler  Date POD PCO2 HCT BP  MCA ACA PCA OPHT SIPH VERT Basilar  02/21/17 MS     Right  Left   31  12.7   *  *   *  *   31  39   36  27   -25  *   *      4/18MS     Right  Left   44  50   -25  *   *  33  48   -26  -26   *      4/20MS     Right  Left   33  *   -31  *   *  *   37  20   39  *   -29  -39   -33            Right  Left                                             Right  Left                                            Right  Left                                            Right  Left                                        MCA = Middle Cerebral Artery      OPHT = Opthalmic Artery     BASILAR = Basilar Artery   ACA = Anterior Cerebral Artery     SIPH = Carotid Siphon PCA = Posterior Cerebral Artery   VERT = Verterbral Artery                   Normal MCA = 62+\-12 ACA = 50+\-12 PCA = 42+\-23   02/21/17-*Unable to insonate due to poor acoustic windows. Right Lindegaard ratio=1.1, left Lindegaard ratio=0.5. 02/22/17- *Unable to insonate due to poor acoustic windows. Lindegaard ratio: Right=1.5, left=1.9 02/24/17- *Unable to insonate due to poor acoustic windows. Right Lindegaard ratio=1.5, unable to calculate left Lindegaard ratio due to poor acoustic windows.  02/24/2017 3:51 PM Gertie Fey, BS, RVT, RDCS, RDMS

## 2017-02-24 NOTE — Progress Notes (Signed)
Pt seen and examined. No issues overnight. HA improved today.  EXAM: Temp:  [97.8 F (36.6 C)-98.8 F (37.1 C)] 98 F (36.7 C) (04/20 0820) Pulse Rate:  [45-174] 52 (04/20 0900) Resp:  [11-21] 15 (04/20 0900) BP: (124-171)/(49-119) 148/68 (04/20 0900) SpO2:  [92 %-97 %] 93 % (04/20 0900) Intake/Output      04/19 0701 - 04/20 0700 04/20 0701 - 04/21 0700   P.O.     I.V. (mL/kg) 2300 (19.1)    IV Piggyback 105    Total Intake(mL/kg) 2405 (20)    Urine (mL/kg/hr) 1025 (0.4)    Emesis/NG output 0 (0)    Total Output 1025     Net +1380          Emesis Occurrence 2 x     Awake, alert, oriented Speech fluent CN intact Good strength  LABS: Lab Results  Component Value Date   CREATININE 0.72 02/23/2017   BUN 16 02/23/2017   NA 133 (L) 02/23/2017   K 4.1 02/23/2017   CL 102 02/23/2017   CO2 24 02/23/2017   Lab Results  Component Value Date   WBC 13.1 (H) 02/23/2017   HGB 14.7 02/23/2017   HCT 43.9 02/23/2017   MCV 96.3 02/23/2017   PLT 198 02/23/2017    IMPRESSION: - 66 y.o. male SAH d# 5 angio negative, neurologically stable - PVC do not require further treatment  PLAN: - Echo today per cardiology - Plan on repeat angiogram Monday am, NPO after MN sunday

## 2017-02-25 DIAGNOSIS — I493 Ventricular premature depolarization: Secondary | ICD-10-CM

## 2017-02-25 LAB — ECHOCARDIOGRAM COMPLETE
Height: 73 in
WEIGHTICAEL: 4246.94 [oz_av]

## 2017-02-25 LAB — TRIGLYCERIDES: TRIGLYCERIDES: 35 mg/dL (ref <150)

## 2017-02-25 MED ORDER — DEXAMETHASONE 4 MG PO TABS
4.0000 mg | ORAL_TABLET | Freq: Two times a day (BID) | ORAL | Status: DC
  Administered 2017-02-25 – 2017-02-27 (×4): 4 mg via ORAL
  Filled 2017-02-25 (×4): qty 1

## 2017-02-25 MED ORDER — LEVETIRACETAM 500 MG PO TABS
500.0000 mg | ORAL_TABLET | Freq: Two times a day (BID) | ORAL | Status: DC
  Administered 2017-02-25 – 2017-02-27 (×5): 500 mg via ORAL
  Filled 2017-02-25 (×5): qty 1

## 2017-02-25 NOTE — Progress Notes (Signed)
Patient ID: Brad Velez, male   DOB: Oct 01, 1951, 66 y.o.   MRN: [id number] BP 138/76   Pulse 64   Temp 98.6 F (37 C) (Oral)   Resp 15   Ht  (1.854 m)   Wt 120.4 kg (265 lb 6.9 oz)   SpO2 93%   BMI 35.02 kg/m  Alert and oriented x4 Light sensitive Following all commands Moving all extremities well

## 2017-02-25 NOTE — Progress Notes (Signed)
   Progress Note  Patient Name: Brad Velez Date of Encounter: 02/25/2017  Primary Cardiologist: Dr. Ellwood Handler  Reviewed chart and recent rounding note by Dr. Donnie Aho. The patient has asymptomatic PVCs documented by telemetry. Echocardiogram has been ordered and is currently pending at this time for assessment of LVEF. Most recent blood pressure 140s over 70s. Plan to follow-up on LVEF when echocardiogram result is available. At this point do not anticipate specific treatment for PVCs other than blood pressure control.  Signed, Nona Dell, MD  02/25/2017, 8:49 AM

## 2017-02-26 MED ORDER — LISINOPRIL 5 MG PO TABS
5.0000 mg | ORAL_TABLET | Freq: Every day | ORAL | Status: DC
  Administered 2017-02-26 – 2017-02-27 (×2): 5 mg via ORAL
  Filled 2017-02-26 (×2): qty 1

## 2017-02-26 NOTE — Progress Notes (Signed)
RN has text paged both neuro and cardiology to see if patient can be prescribed daily BP medication. Pressures in the 160-170 range until hydralazine given then they are in the 120's, but when medication wears off pressure climb again. Waiting on response from MD. Pt and wife state he was previously on medication but has not continued it. They were unable to tell RN the name of the medication.

## 2017-02-26 NOTE — Progress Notes (Signed)
   Progress Note  Patient Name: TANAV ORSAK Date of Encounter: 02/26/2017  Primary Cardiologist: Dr. Ellwood Handler  Reviewed interval charting and telemetry. Sinus rhythm with occasional PVCs noted. Recent systolic blood pressures 120s to 150s. Echocardiogram obtained yesterday showed moderate LVH with LVEF 55-60% and grade 2 diastolic dysfunction, also mild aortic stenosis. Do not anticipate further specific treatment for PVCs at this time.  Signed, Nona Dell, MD  02/26/2017, 8:19 AM

## 2017-02-26 NOTE — Progress Notes (Signed)
     RN giving PRN hydralazine freq and blood pressure improves but rebounds hypertensive. No reported hx of allergies to ACEi. Will add low dose lisinopril today. Primary to titrate up as needed. BMET in the am.   Signed, Laverda Page, NP-C 02/26/2017, 11:51 AM Pager: [phone]

## 2017-02-27 ENCOUNTER — Inpatient Hospital Stay (HOSPITAL_COMMUNITY): Payer: Medicare Other

## 2017-02-27 ENCOUNTER — Other Ambulatory Visit (HOSPITAL_COMMUNITY): Payer: No Typology Code available for payment source

## 2017-02-27 HISTORY — PX: IR ANGIO INTRA EXTRACRAN SEL INTERNAL CAROTID BILAT MOD SED: IMG5363

## 2017-02-27 HISTORY — PX: IR ANGIO VERTEBRAL SEL VERTEBRAL BILAT MOD SED: IMG5369

## 2017-02-27 LAB — BASIC METABOLIC PANEL
Anion gap: 7 (ref 5–15)
BUN: 17 mg/dL (ref 6–20)
CHLORIDE: 97 mmol/L — AB (ref 101–111)
CO2: 28 mmol/L (ref 22–32)
Calcium: 8.4 mg/dL — ABNORMAL LOW (ref 8.9–10.3)
Creatinine, Ser: 0.98 mg/dL (ref 0.61–1.24)
GFR calc Af Amer: >60 mL/min (ref >60)
GFR calc non Af Amer: >60 mL/min (ref >60)
Glucose, Bld: 112 mg/dL — ABNORMAL HIGH (ref 65–99)
Potassium: 3.7 mmol/L (ref 3.5–5.1)
SODIUM: 132 mmol/L — AB (ref 135–145)

## 2017-02-27 MED ORDER — IOPAMIDOL (ISOVUE-300) INJECTION 61%
INTRAVENOUS | Status: AC
  Administered 2017-02-27: 85 mL
  Filled 2017-02-27: qty 100

## 2017-02-27 MED ORDER — MIDAZOLAM HCL 2 MG/2ML IJ SOLN
INTRAMUSCULAR | Status: AC | PRN
  Administered 2017-02-27: 1 mg via INTRAVENOUS

## 2017-02-27 MED ORDER — LIDOCAINE HCL 1 % IJ SOLN
INTRAMUSCULAR | Status: AC
  Filled 2017-02-27: qty 20

## 2017-02-27 MED ORDER — LISINOPRIL 5 MG PO TABS: 5.0000 mg | ORAL_TABLET | Freq: Every day | ORAL | 3 refills | Status: AC

## 2017-02-27 MED ORDER — SODIUM CHLORIDE 0.9 % IV SOLN
INTRAVENOUS | Status: AC | PRN
  Administered 2017-02-27: 10 mL/h via INTRAVENOUS

## 2017-02-27 MED ORDER — FENTANYL CITRATE (PF) 100 MCG/2ML IJ SOLN
INTRAMUSCULAR | Status: AC
  Filled 2017-02-27: qty 2

## 2017-02-27 MED ORDER — IOPAMIDOL (ISOVUE-300) INJECTION 61%
INTRAVENOUS | Status: AC
  Filled 2017-02-27: qty 50

## 2017-02-27 MED ORDER — BUTALBITAL-APAP-CAFFEINE 50-325-40 MG PO TABS: 1.0000 | ORAL_TABLET | Freq: Four times a day (QID) | ORAL | 0 refills | Status: AC | PRN

## 2017-02-27 MED ORDER — MIDAZOLAM HCL 2 MG/2ML IJ SOLN
INTRAMUSCULAR | Status: AC
  Filled 2017-02-27: qty 2

## 2017-02-27 MED ORDER — HEPARIN SODIUM (PORCINE) 1000 UNIT/ML IJ SOLN
INTRAMUSCULAR | Status: AC
  Filled 2017-02-27: qty 2

## 2017-02-27 MED ORDER — FENTANYL CITRATE (PF) 100 MCG/2ML IJ SOLN
INTRAMUSCULAR | Status: AC | PRN
  Administered 2017-02-27: 25 ug via INTRAVENOUS

## 2017-02-27 MED ORDER — LIDOCAINE HCL (PF) 1 % IJ SOLN
INTRAMUSCULAR | Status: AC | PRN
  Administered 2017-02-27: 10 mL

## 2017-02-27 MED ORDER — FOLIC ACID 1 MG PO TABS: 1.0000 mg | ORAL_TABLET | Freq: Every day | ORAL | 0 refills | Status: AC

## 2017-02-27 NOTE — Brief Op Note (Signed)
PREOP DX: Subarachnoid Hemorrhage  POSTOP DX: Same  PROCEDURE: Diagnostic cerebral angiogram  SURGEON: Demar Shad  ASSISTANT: None  ANESTHESIA: IV sed with local  EBL: Minimal  SPECIMENS: None  DRAINS: None  COMPLICATIONS: None immediate  CONDITION: Stable to ICU  FINDINGS:  1. Normal cerebral angiogram. No aneurysms/AVM/Fistulas seen 2. No vasospasm

## 2017-02-27 NOTE — Progress Notes (Signed)
Pt seen and examined. No issues overnight. HA improving  EXAM: Temp:  [97.5 F (36.4 C)-99.4 F (37.4 C)] 99 F (37.2 C) (04/23 0800) Pulse Rate:  [56-65] 56 (04/23 0900) Resp:  [10-17] 14 (04/23 0900) BP: (119-171)/(56-117) 161/68 (04/23 0800) SpO2:  [91 %-96 %] 95 % (04/23 0900) Intake/Output      04/22 0701 - 04/23 0700 04/23 0701 - 04/24 0700   P.O. 720    I.V. (mL/kg) 2400 (19.9) 200 (1.7)   Total Intake(mL/kg) 3120 (25.9) 200 (1.7)   Urine (mL/kg/hr) 2650 (0.9)    Total Output 2650     Net +470 +200        Urine Occurrence 1 x     Awake, alert, oriented Speech fluent CN intact MAE good strength  LABS: Lab Results  Component Value Date   CREATININE 0.98 02/27/2017   BUN 17 02/27/2017   NA 132 (L) 02/27/2017   K 3.7 02/27/2017   CL 97 (L) 02/27/2017   CO2 28 02/27/2017   Lab Results  Component Value Date   WBC 13.1 (H) 02/23/2017   HGB 14.7 02/23/2017   HCT 43.9 02/23/2017   MCV 96.3 02/23/2017   PLT 198 02/23/2017    IMPRESSION: - 66 y.o. male SAH d# 8 angio neg. Neurologically stable  PLAN: - Diagnostic cerebral angiogram today - If negative again, may consider d/c home

## 2017-02-27 NOTE — Discharge Summary (Signed)
Physician Discharge Summary  Patient ID: Brad Velez MRN: [id number] DOB/AGE: 1951/09/29 66 y.o.  Admit date: 02/20/2017 Discharge date: 02/27/2017  Admission Diagnoses:  Subarachnoid Hemorrhage  Discharge Diagnoses:  Same Active Problems:   SAH (subarachnoid hemorrhage) (HCC)   Obesity (BMI 30-39.9)   Hypertensive heart disease   Sleep apnea   Premature ventricular contractions (PVCs) (VPCs)   Hyperlipidemia   Discharged Condition: Stable  Hospital Course:  Brad Velez is a 66 y.o. male presenting with severe sudden HA. CT demonstrated subarachnoid hemorrhage, CTA and initial diagnostic angiogram were negative. He was monitored in the ICU without neurologic change for 7 days. He did have PVC, cardiology did not feel any treatment was required. He was started on lisinopril. Repeat diagnostic angiogram was also negative. He was therefore discharged home in stable condition.  Treatments:  Diagnostic angiogram x 2  Consults: Cardiology - Dr. Donnie Aho  Discharge Exam: Blood pressure 139/70, pulse 63, temperature 99 F (37.2 C), temperature source Oral, resp. rate 14, height  (1.854 m), weight 120.4 kg (265 lb 6.9 oz), SpO2 96 %. Awake, alert, oriented Speech fluent, appropriate CN grossly intact 5/5 BUE/BLE Wound c/d/i  Disposition: 01-Home or Self Care  Discharge Instructions    Call MD for:  difficulty breathing, headache or visual disturbances    Complete by:  As directed    Call MD for:  redness, tenderness, or signs of infection (pain, swelling, redness, odor or green/yellow discharge around incision site)    Complete by:  As directed    Call MD for:  severe uncontrolled pain    Complete by:  As directed    Call MD for:  temperature >100.4    Complete by:  As directed    Diet - low sodium heart healthy    Complete by:  As directed    Discharge instructions    Complete by:  As directed    Walk at home as much as possible, at least 4 times / day   Driving Restrictions    Complete by:  As directed    Until first follow-up   Increase activity slowly    Complete by:  As directed    Lifting restrictions    Complete by:  As directed    No lifting > 10 lbs   May shower / Bathe    Complete by:  As directed    48 hours after surgery   May walk up steps    Complete by:  As directed    No dressing needed    Complete by:  As directed    Other Restrictions    Complete by:  As directed    No bending/twisting at waist     Allergies as of 02/27/2017   No Known Allergies     Medication List    STOP taking these medications   amoxicillin-clavulanate 875-125 MG tablet Commonly known as:  AUGMENTIN   aspirin EC 81 MG tablet   meloxicam 15 MG tablet Commonly known as:  MOBIC     TAKE these medications   amoxicillin 500 MG capsule Commonly known as:  AMOXIL Take 2,000 mg by mouth See admin instructions. One hour prior to dental appointments for pre-treatment   butalbital-acetaminophen-caffeine 50-325-40 MG tablet Commonly known as:  FIORICET, ESGIC Take 1 tablet by mouth every 6 (six) hours as needed for headache.   cetirizine 10 MG tablet Commonly known as:  ZYRTEC Take 10 mg by mouth daily.   folic acid 1  MG tablet Commonly known as:  FOLVITE Take 1 tablet (1 mg total) by mouth daily.   ibuprofen 200 MG tablet Commonly known as:  ADVIL,MOTRIN Take 200-400 mg by mouth every 6 (six) hours as needed (for muscle soreness).   lisinopril 5 MG tablet Commonly known as:  PRINIVIL,ZESTRIL Take 1 tablet (5 mg total) by mouth daily.   rosuvastatin 10 MG tablet Commonly known as:  CRESTOR Take 10 mg by mouth daily.      Follow-up Information    JOBE,DANIEL B., MD Follow up in 2 week(s).   Specialty:  Internal Medicine Contact information: [address] [place] [address] [phone]        Lisbeth Renshaw, C, MD Follow up in 3 week(s).   Specialty:  Neurosurgery Contact information: [address] [place] [address] [phone]           Signed: Lisbeth Renshaw, Salena Saner 02/27/2017, 10:26 AM

## 2017-02-27 NOTE — Progress Notes (Signed)
IV removed with no complications. All personal effects are accounted for. Patient discharging to home with wife. All instructions have been reviewed and all questions answered.

## 2017-02-27 NOTE — Sedation Documentation (Signed)
5 Fr. Exoseal to right groin 

## 2017-02-27 NOTE — Progress Notes (Signed)
Patient off floor for cardiology procedure

## 2017-03-20 ENCOUNTER — Encounter (HOSPITAL_COMMUNITY): Payer: Self-pay | Admitting: Neurosurgery

## 2019-12-04 ENCOUNTER — Other Ambulatory Visit: Payer: No Typology Code available for payment source

## 2019-12-17 ENCOUNTER — Ambulatory Visit: Payer: No Typology Code available for payment source | Attending: Internal Medicine

## 2019-12-19 ENCOUNTER — Ambulatory Visit: Payer: No Typology Code available for payment source

## 2020-11-13 ENCOUNTER — Other Ambulatory Visit: Payer: Self-pay | Admitting: Orthopedic Surgery

## 2020-11-13 DIAGNOSIS — M25511 Pain in right shoulder: Secondary | ICD-10-CM

## 2020-11-15 ENCOUNTER — Ambulatory Visit
Admission: RE | Admit: 2020-11-15 | Discharge: 2020-11-15 | Disposition: A | Discharge status: Home / Self Care | Payer: Medicare Other | Source: Ambulatory Visit | Attending: Orthopedic Surgery | Admitting: Orthopedic Surgery

## 2020-11-15 ENCOUNTER — Other Ambulatory Visit: Payer: Self-pay

## 2020-11-15 DIAGNOSIS — M25511 Pain in right shoulder: Secondary | ICD-10-CM

## 2020-12-09 ENCOUNTER — Encounter: Payer: Self-pay | Admitting: Physical Therapy

## 2020-12-09 ENCOUNTER — Other Ambulatory Visit: Payer: Self-pay

## 2020-12-09 ENCOUNTER — Ambulatory Visit: Payer: Medicare Other | Attending: Orthopedic Surgery | Admitting: Physical Therapy

## 2020-12-09 DIAGNOSIS — M25511 Pain in right shoulder: Secondary | ICD-10-CM | POA: Insufficient documentation

## 2020-12-09 DIAGNOSIS — M25611 Stiffness of right shoulder, not elsewhere classified: Secondary | ICD-10-CM | POA: Diagnosis present

## 2020-12-09 DIAGNOSIS — M6281 Muscle weakness (generalized): Secondary | ICD-10-CM | POA: Diagnosis present

## 2020-12-09 NOTE — Patient Instructions (Signed)
Access Code: [email or web address] URL: [email or web address] Date: 12/09/2020 Prepared by: Lysle Rubens  Exercises Seated Scapular Retraction - 1 x daily - 7 x weekly - 3 sets - 10 reps - 3 sec hold Circular Shoulder Pendulum with Table Support - 1 x daily - 7 x weekly - 3 sets - 10 reps Shoulder Flexion Wall Slide with Towel - 1 x daily - 7 x weekly - 3 sets - 10 reps Standing Isometric Shoulder Internal Rotation at Doorway - 1 x daily - 7 x weekly - 3 sets - 10 reps Isometric Shoulder Flexion at Wall - 1 x daily - 7 x weekly - 3 sets - 10 reps Isometric Shoulder Extension at Wall - 1 x daily - 7 x weekly - 3 sets - 10 reps Isometric Shoulder Abduction at Wall - 1 x daily - 7 x weekly - 3 sets - 10 reps Isometric Shoulder External Rotation at Wall - 1 x daily - 7 x weekly - 3 sets - 10 reps

## 2020-12-09 NOTE — Therapy (Signed)
[facility] [address] [place], [place], [address] Phone: [phone]   Fax:  [phone]  Physical Therapy Evaluation  Patient Details  Name: Brad Velez MRN: [id number] Date of Birth: 04-02-1951 Referring Provider (PT): Lucey   Encounter Date: 12/09/2020   PT End of Session - 12/09/20 0837    Visit Number 1    Date for PT Re-Evaluation 02/06/21    PT Start Time 0802    PT Stop Time 0837    PT Time Calculation (min) 35 min    Activity Tolerance Patient tolerated treatment well    Behavior During Therapy [facility] for tasks assessed/performed           Past Medical History:  Diagnosis Date  . H/O hiatal hernia   . Hyperlipidemia 02/23/2017  . Hypertension    hx of but off of medication for 5 yrs  . Obesity (BMI 30-39.9) 02/23/2017  . Pneumonia    1- 2 yrs ago  . Premature ventricular contractions (PVCs) (VPCs) 02/23/2017  . SAH (subarachnoid hemorrhage) (HCC) 02/20/2017  . Sleep apnea    doesnt use CPAP- tested 15 yrs ago    Past Surgical History:  Procedure Laterality Date  . COLONOSCOPY    . IR ANGIO EXTERNAL CAROTID SEL EXT CAROTID BILAT MOD SED  02/20/2017  . IR ANGIO INTRA EXTRACRAN SEL INTERNAL CAROTID BILAT MOD SED  02/20/2017  . IR ANGIO INTRA EXTRACRAN SEL INTERNAL CAROTID BILAT MOD SED  02/27/2017  . IR ANGIO VERTEBRAL SEL VERTEBRAL BILAT MOD SED  02/20/2017  . IR ANGIO VERTEBRAL SEL VERTEBRAL BILAT MOD SED  02/27/2017  . IR ANGIOGRAM SELECTIVE EACH ADDITIONAL VESSEL  02/20/2017  . KNEE ARTHROSCOPY     right  . TONSILLECTOMY    . TOTAL KNEE ARTHROPLASTY  04/30/2012   Procedure: TOTAL KNEE ARTHROPLASTY;  Surgeon: Raymon Mutton, MD;  Location: [facility] OR;  Service: Orthopedics;  Laterality: Right;  . UPPER GASTROINTESTINAL ENDOSCOPY    . VASECTOMY      There were no vitals filed for this visit.    Subjective Assessment - 12/09/20 0806    Subjective Pt reports to clinic s/p R shoulder scope 12/02/20. Pt reports  he has had this done before and it was helpful. Pt reports that he was having difficulty reaching overhead, lifting, and twisting to grab objects. Pt denies N/T in hands. 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    Limitations Lifting;House hold activities    Diagnostic tests xrays    Currently in Pain? No/denies    Pain Score 0-No pain   reports no pain at rest   Pain Location Shoulder    Pain Orientation Right    Pain Descriptors / Indicators Sharp    Pain Type Surgical pain    Pain Onset 1 to 4 weeks ago    Pain Frequency Intermittent    Aggravating Factors  lifting, reaching overhead, twisting to reach something    Pain Relieving Factors rest, tylenol, ice              [facility] PT Assessment - 12/09/20 0001      Assessment   Medical Diagnosis R shoulder arthroscopy    Referring Provider (PT) Lucey    Onset Date/Surgical Date 12/02/20    Hand Dominance Right    Next MD Visit --   01/11/21 approximately   Prior  Therapy PT for shoulder      Precautions   Precautions None      Restrictions   Weight Bearing Restrictions No      Balance Screen   Has the patient fallen in the past 6 months Yes    How many times? 1   during snow slipped on ice   Has the patient had a decrease in activity level because of a fear of falling?  No    Is the patient reluctant to leave their home because of a fear of falling?  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[employer], Hydrographic surveyor [employer]      ROM / Strength   AROM / PROM / Strength AROM;PROM;Strength      AROM   AROM Assessment Site Shoulder    Right/Left Shoulder Right;Left    Right Shoulder Flexion 150  Degrees   pain ~90 deg   Right Shoulder ABduction 175 Degrees   mild pain   Right Shoulder Internal Rotation --   functionally to C7 slow/guarded   Right Shoulder External Rotation 10 Degrees   to iliac crest   Left Shoulder Flexion 180 Degrees    Left Shoulder ABduction 180 Degrees      PROM   PROM Assessment Site Shoulder    Right/Left Shoulder Right    Right Shoulder Flexion 160 Degrees    Right Shoulder ABduction 180 Degrees    Right Shoulder External Rotation 20 Degrees      Strength   Overall Strength Comments BUE 5/5 except R shoulder IR/ER 4/5      Palpation   Palpation comment no tenderness to palpation thoracic spine or R shoulder                      Objective measurements completed on examination: See above findings.       [facility] Adult PT Treatment/Exercise - 12/09/20 0001      Exercises   Exercises Shoulder      Shoulder Exercises: Standing   Other Standing Exercises shoulder isometrics all directions x10    Other Standing Exercises scap retraction 3 sec hold; shoulder pendulums b/n ex's                  PT Education - 12/09/20 0837    Education Details Pt educated on POC and HEP    Person(s) Educated Patient    Methods Explanation;Demonstration;Handout    Comprehension Verbalized understanding;Returned demonstration            PT Short Term Goals - 12/09/20 0838      PT SHORT TERM GOAL #1   Title Pt will be I with initial HEP    Time 2    Period Weeks    Status New    Target Date 12/23/20             PT Long Term Goals - 12/09/20 0838      PT LONG TERM GOAL #1   Title Pt will be I with advanced HEP    Time 8    Period Weeks    Status New    Target Date 02/03/21  PT LONG TERM GOAL #2   Title Pt will demo R shoulder AROM equivalent to L shoulder    Time 8    Period Weeks    Status New    Target Date 02/03/21      PT LONG TERM GOAL #3   Title Pt will demo able to lift up to 25 lbs with no increase in R  shoulder pain    Time 8    Period Weeks    Status New    Target Date 02/03/21      PT LONG TERM GOAL #4   Title Pt will report able to perform all ADLs including reaching overhead with no increase in R shoulder pain    Time 8    Period Weeks    Status New    Target Date 02/03/21      PT LONG TERM GOAL #5   Title Pt will demo R shoulder IR/ER MMT 5/5    Baseline 4/5    Time 8    Period Weeks    Status New    Target Date 02/03/21                  Plan - 12/09/20 1028    Clinical Impression Statement Pt presents to clinic s/p R shoulder arthroscopy with subacromial decompression 12/02/2020. Pt reports chronic R shoulder pain limited reaching, overhead activities, and lifting. Pt has had procedure before and is trying to delay R shoulder replacement. Pt demos limited R shoulder flexion/IR/ER, pain with R shoulder abduction, MMT limited d/t pain, and functional ROM/strength deficits. 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 Pt needs to be able to lift up to 25 lbs to return to working with son. No precautions per MD; gentle progression of shoulder ROM and initiation of gentle PREs. Pt would benefit from skilled PT to address the above impairments.    Personal Factors and Comorbidities Comorbidity 3+    Comorbidities hiatial hernia, HTN, hx of subarachnoid hemorrhage (2018)    Examination-Activity Limitations Lift;Reach Overhead;Carry    Examination-Participation Restrictions Occupation;Community Activity;Interpersonal Relationship    Stability/Clinical Decision Making Evolving/Moderate complexity    Clinical Decision Making Low    Rehab Potential Good    PT Frequency 2x / week    PT Duration 6 weeks    PT Treatment/Interventions ADLs/Self Care Home Management;Electrical Stimulation;Iontophoresis 4mg /ml Dexamethasone;Moist Heat;Therapeutic activities;Therapeutic exercise;Manual techniques;Patient/family education;Passive range of  motion;Dry needling;Vasopneumatic Device;Taping    PT Next Visit Plan progress HEP as indicated, initiate ROM and gentle PREs    PT Home Exercise Plan see pt instructions    Consulted and Agree with Plan of Care Patient           Patient will benefit from skilled therapeutic intervention in order to improve the following deficits and impairments:  Decreased range of motion,Increased muscle spasms,Impaired UE functional use,Pain,Hypomobility,Decreased strength,Postural dysfunction  Visit Diagnosis: Acute pain of right shoulder  Stiffness of right shoulder, not elsewhere classified  Muscle weakness (generalized)     Problem List Patient Active Problem List   Diagnosis Date Noted  . Obesity (BMI 30-39.9) 02/23/2017  . Hypertensive heart disease 02/23/2017  . Sleep apnea 02/23/2017  . Premature ventricular contractions (PVCs) (VPCs) 02/23/2017  . Hyperlipidemia 02/23/2017  . SAH (subarachnoid hemorrhage) (HCC) 02/20/2017   02/22/2017, PT, DPT Lysle Rubens Akeila Lana 12/09/2020, 10:32 AM  [facility] [address] [place], [place], [place] Phone: [phone]  Fax:  [phone]  Name: Brad Velez MRN: [id number] Date of Birth: Jan 12, 1951

## 2020-12-14 ENCOUNTER — Encounter: Payer: Self-pay | Admitting: Physical Therapy

## 2020-12-14 ENCOUNTER — Ambulatory Visit: Payer: Medicare Other | Admitting: Physical Therapy

## 2020-12-14 ENCOUNTER — Other Ambulatory Visit: Payer: Self-pay

## 2020-12-14 DIAGNOSIS — M25511 Pain in right shoulder: Secondary | ICD-10-CM | POA: Diagnosis not present

## 2020-12-14 DIAGNOSIS — M6281 Muscle weakness (generalized): Secondary | ICD-10-CM

## 2020-12-14 DIAGNOSIS — M25611 Stiffness of right shoulder, not elsewhere classified: Secondary | ICD-10-CM

## 2020-12-14 NOTE — Therapy (Signed)
[facility] [place] [facility] [address] [place], [place], [address] Phone: [phone]   Fax:  [phone]  Physical Therapy Treatment  Patient Details  Name: Brad Velez MRN: [id number] Date of Birth: 04-26-1951 Referring Provider (PT): Lucey   Encounter Date: 12/14/2020   PT End of Session - 12/14/20 0837    Visit Number 2    Date for PT Re-Evaluation 02/06/21    PT Start Time 0800    PT Stop Time 0840    PT Time Calculation (min) 40 min    Activity Tolerance Patient tolerated treatment well    Behavior During Therapy [facility] for tasks assessed/performed           Past Medical History:  Diagnosis Date  . H/O hiatal hernia   . Hyperlipidemia 02/23/2017  . Hypertension    hx of but off of medication for 5 yrs  . Obesity (BMI 30-39.9) 02/23/2017  . Pneumonia    1- 2 yrs ago  . Premature ventricular contractions (PVCs) (VPCs) 02/23/2017  . SAH (subarachnoid hemorrhage) (HCC) 02/20/2017  . Sleep apnea    doesnt use CPAP- tested 15 yrs ago    Past Surgical History:  Procedure Laterality Date  . COLONOSCOPY    . IR ANGIO EXTERNAL CAROTID SEL EXT CAROTID BILAT MOD SED  02/20/2017  . IR ANGIO INTRA EXTRACRAN SEL INTERNAL CAROTID BILAT MOD SED  02/20/2017  . IR ANGIO INTRA EXTRACRAN SEL INTERNAL CAROTID BILAT MOD SED  02/27/2017  . IR ANGIO VERTEBRAL SEL VERTEBRAL BILAT MOD SED  02/20/2017  . IR ANGIO VERTEBRAL SEL VERTEBRAL BILAT MOD SED  02/27/2017  . IR ANGIOGRAM SELECTIVE EACH ADDITIONAL VESSEL  02/20/2017  . KNEE ARTHROSCOPY     right  . TONSILLECTOMY    . TOTAL KNEE ARTHROPLASTY  04/30/2012   Procedure: TOTAL KNEE ARTHROPLASTY;  Surgeon: Raymon Mutton, MD;  Location: [facility] OR;  Service: Orthopedics;  Laterality: Right;  . UPPER GASTROINTESTINAL ENDOSCOPY    . VASECTOMY      There were no vitals filed for this visit.   Subjective Assessment - 12/14/20 0803    Subjective Doing all right, only have pain when he moves it certain ways.     Currently in Pain? Yes    Pain Score 2     Pain Location Shoulder    Pain Orientation Right                             [facility] Adult PT Treatment/Exercise - 12/14/20 0001      Shoulder Exercises: Standing   External Rotation Strengthening;Right;Theraband;20 reps    Theraband Level (Shoulder External Rotation) Level 2 (Red)    Internal Rotation Theraband;20 reps;Right;Strengthening    Theraband Level (Shoulder Internal Rotation) Level 2 (Red)    Flexion Weights;20 reps;Both;Strengthening    Shoulder Flexion Weight (lbs) 3    ABduction 20 reps;Weights;Both;Strengthening    Shoulder ABduction Weight (lbs) 3    Extension Theraband;20 reps;Both;Strengthening    Theraband Level (Shoulder Extension) Level 3 (Green)    Row Theraband;20 reps;Strengthening;Both    Theraband Level (Shoulder Row) Level 3 (Green)    Other Standing Exercises Tricep Ext 35lb 2x15, Bicep Curls 20lb 2x15    Other Standing Exercises AAROM flex, Ext, IR up back 2lb bar x10 each      Shoulder Exercises: Power Therapist, occupational & Lats 20lb 2x10  PT Short Term Goals - 12/14/20 0841      PT SHORT TERM GOAL #1   Title Pt will be I with initial HEP    Status Achieved             PT Long Term Goals - 12/09/20 0086      PT LONG TERM GOAL #1   Title Pt will be I with advanced HEP    Time 8    Period Weeks    Status New    Target Date 02/03/21      PT LONG TERM GOAL #2   Title Pt will demo R shoulder AROM equivalent to L shoulder    Time 8    Period Weeks    Status New    Target Date 02/03/21      PT LONG TERM GOAL #3   Title Pt will demo able to lift up to 25 lbs with no increase in R shoulder pain    Time 8    Period Weeks    Status New    Target Date 02/03/21      PT LONG TERM GOAL #4   Title Pt will report able to perform all ADLs including reaching overhead with no increase in R shoulder pain    Time 8    Period Weeks     Status New    Target Date 02/03/21      PT LONG TERM GOAL #5   Title Pt will demo R shoulder IR/ER MMT 5/5    Baseline 4/5    Time 8    Period Weeks    Status New    Target Date 02/03/21                 Plan - 12/14/20 7619    Clinical Impression Statement Pt tolerated an initial progression to  TE well under light resistance. 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 He reported compliance with HEP. Pt had good AROM but pain at eng range. Postural cues needed at time throughout session. Tactile cues to RUE needed with ER/IR to maintain proper arm positioning. Pt denied ice post treatment.   Personal Factors and Comorbidities Comorbidity 3+    Comorbidities hiatial hernia, HTN, hx of subarachnoid hemorrhage (2018)    Examination-Activity Limitations Lift;Reach Overhead;Carry    Examination-Participation Restrictions Occupation;Community Activity;Interpersonal Relationship    Stability/Clinical Decision Making Evolving/Moderate complexity    Rehab Potential Good    PT Frequency 2x / week    PT Duration 6 weeks    PT Treatment/Interventions ADLs/Self Care Home Management;Electrical Stimulation;Iontophoresis 4mg /ml Dexamethasone;Moist Heat;Therapeutic activities;Therapeutic exercise;Manual techniques;Patient/family education;Passive range of motion;Dry needling;Vasopneumatic Device;Taping    PT Next Visit Plan initiate ROM and gentle PREs           Patient will benefit from skilled therapeutic intervention in order to improve the following deficits and impairments:  Decreased range of motion,Increased muscle spasms,Impaired UE functional use,Pain,Hypomobility,Decreased strength,Postural dysfunction  Visit Diagnosis: Stiffness of right shoulder, not elsewhere classified  Muscle weakness (generalized)  Acute pain of right shoulder     Problem List Patient Active Problem List   Diagnosis Date Noted  .  Obesity (BMI 30-39.9) 02/23/2017  . Hypertensive heart disease 02/23/2017  . Sleep apnea 02/23/2017  . Premature ventricular contractions (PVCs) (VPCs) 02/23/2017  . Hyperlipidemia 02/23/2017  . SAH (subarachnoid hemorrhage) (HCC) 02/20/2017    02/22/2017, PTA  12/14/2020, 8:42 AM  [facility] [address] [place], [place], [address] Phone: [phone]   Fax:  [phone]  Name: SATHVIK TIEDT MRN: [id number] Date of Birth: 1951-09-12

## 2020-12-16 ENCOUNTER — Other Ambulatory Visit: Payer: Self-pay

## 2020-12-16 ENCOUNTER — Ambulatory Visit: Payer: Medicare Other | Admitting: Physical Therapy

## 2020-12-16 ENCOUNTER — Encounter: Payer: Self-pay | Admitting: Physical Therapy

## 2020-12-16 DIAGNOSIS — M25511 Pain in right shoulder: Secondary | ICD-10-CM

## 2020-12-16 DIAGNOSIS — M25611 Stiffness of right shoulder, not elsewhere classified: Secondary | ICD-10-CM

## 2020-12-16 DIAGNOSIS — M6281 Muscle weakness (generalized): Secondary | ICD-10-CM

## 2020-12-16 NOTE — Therapy (Signed)
[facility] [address] [place], [place], [address] Phone: [phone]   Fax:  [phone]  Physical Therapy Treatment  Patient Details  Name: Brad Velez MRN: [id number] Date of Birth: 1951-04-07 Referring Provider (PT): Lucey   Encounter Date: 12/16/2020   PT End of Session - 12/16/20 0836    Visit Number 3    Date for PT Re-Evaluation 02/06/21    PT Start Time 0800    PT Stop Time 0835    PT Time Calculation (min) 35 min    Activity Tolerance Patient tolerated treatment well    Behavior During Therapy [facility] for tasks assessed/performed           Past Medical History:  Diagnosis Date  . H/O hiatal hernia   . Hyperlipidemia 02/23/2017  . Hypertension    hx of but off of medication for 5 yrs  . Obesity (BMI 30-39.9) 02/23/2017  . Pneumonia    1- 2 yrs ago  . Premature ventricular contractions (PVCs) (VPCs) 02/23/2017  . SAH (subarachnoid hemorrhage) (HCC) 02/20/2017  . Sleep apnea    doesnt use CPAP- tested 15 yrs ago    Past Surgical History:  Procedure Laterality Date  . COLONOSCOPY    . IR ANGIO EXTERNAL CAROTID SEL EXT CAROTID BILAT MOD SED  02/20/2017  . IR ANGIO INTRA EXTRACRAN SEL INTERNAL CAROTID BILAT MOD SED  02/20/2017  . IR ANGIO INTRA EXTRACRAN SEL INTERNAL CAROTID BILAT MOD SED  02/27/2017  . IR ANGIO VERTEBRAL SEL VERTEBRAL BILAT MOD SED  02/20/2017  . IR ANGIO VERTEBRAL SEL VERTEBRAL BILAT MOD SED  02/27/2017  . IR ANGIOGRAM SELECTIVE EACH ADDITIONAL VESSEL  02/20/2017  . KNEE ARTHROSCOPY     right  . TONSILLECTOMY    . TOTAL KNEE ARTHROPLASTY  04/30/2012   Procedure: TOTAL KNEE ARTHROPLASTY;  Surgeon: Raymon Mutton, MD;  Location: [facility] OR;  Service: Orthopedics;  Laterality: Right;  . UPPER GASTROINTESTINAL ENDOSCOPY    . VASECTOMY      There were no vitals filed for this visit.   Subjective Assessment - 12/16/20 0758    Subjective Fantastic, getting better, almost no pain    Currently in Pain?  No/denies                             [facility] Adult PT Treatment/Exercise - 12/16/20 0001      Shoulder Exercises: Standing   Horizontal ABduction Strengthening;Both;Theraband;20 reps    External Rotation Strengthening;Right;Theraband;20 reps    Theraband Level (Shoulder External Rotation) Level 3 (Green)    Internal Rotation Theraband;20 reps;Right;Strengthening    Theraband Level (Shoulder Internal Rotation) Level 3 (Green)    Flexion Weights;20 reps;Both;Strengthening    Shoulder Flexion Weight (lbs) 3    ABduction 20 reps;Weights;Both;Strengthening    Shoulder ABduction Weight (lbs) 3    Extension 20 reps;Both;Strengthening;Weights    Extension Weight (lbs) 10    Other Standing Exercises 3 way scap stsb red x10 each    Other Standing Exercises yeller ER abd to 90 x10      Shoulder Exercises: ROM/Strengthening   UBE (Upper Arm Bike) L1.6 x3 min each      Shoulder Exercises: Power Therapist, occupational & Lats 25lb 2x10    Other Power Museum/gallery curator 5lb 2x10                    PT  Short Term Goals - 12/14/20 0841      PT SHORT TERM GOAL #1   Title Pt will be I with initial HEP    Status Achieved             PT Long Term Goals - 12/09/20 8185      PT LONG TERM GOAL #1   Title Pt will be I with advanced HEP    Time 8    Period Weeks    Status New    Target Date 02/03/21      PT LONG TERM GOAL #2   Title Pt will demo R shoulder AROM equivalent to L shoulder    Time 8    Period Weeks    Status New    Target Date 02/03/21      PT LONG TERM GOAL #3   Title Pt will demo able to lift up to 25 lbs with no increase in R shoulder pain    Time 8    Period Weeks    Status New    Target Date 02/03/21      PT LONG TERM GOAL #4   Title Pt will report able to perform all ADLs including reaching overhead with no increase in R shoulder pain    Time 8    Period Weeks    Status New    Target Date 02/03/21      PT LONG  TERM GOAL #5   Title Pt will demo R shoulder IR/ER MMT 5/5    Baseline 4/5    Time 8    Period Weeks    Status New    Target Date 02/03/21                 Plan - 12/16/20 0837    Clinical Impression Statement Pt enters clinic reporting improvement  overall. He did well with all the interventions. He has some difficulty with RUE external rotation with RUE abducted to 90, he also reports a catching feeling with this. RUE weakness also exposed with standing flexion and abduction. No reports of pain throughout session.    Personal Factors and Comorbidities Comorbidity 3+    Comorbidities hiatial hernia, HTN, hx of subarachnoid hemorrhage (2018)    Examination-Activity Limitations Lift;Reach Overhead;Carry    Examination-Participation Restrictions Occupation;Community Activity;Interpersonal Relationship    Stability/Clinical Decision Making Evolving/Moderate complexity    Rehab Potential Good    PT Frequency 2x / week    PT Duration 6 weeks    PT Treatment/Interventions ADLs/Self Care Home Management;Electrical Stimulation;Iontophoresis 4mg /ml Dexamethasone;Moist Heat;Therapeutic activities;Therapeutic exercise;Manual techniques;Patient/family education;Passive range of motion;Dry needling;Vasopneumatic Device;Taping    PT Next Visit Plan ROM and gentle PREs           Patient will benefit from skilled therapeutic intervention in order to improve the following deficits and impairments:  Decreased range of motion,Increased muscle spasms,Impaired UE functional use,Pain,Hypomobility,Decreased strength,Postural dysfunction  Visit Diagnosis: Stiffness of right shoulder, not elsewhere classified  Muscle weakness (generalized)  Acute pain of right shoulder     Problem List Patient Active Problem List   Diagnosis Date Noted  . Obesity (BMI 30-39.9) 02/23/2017  . Hypertensive heart disease 02/23/2017  . Sleep apnea 02/23/2017  . Premature ventricular contractions (PVCs) (VPCs)  02/23/2017  . Hyperlipidemia 02/23/2017  . SAH (subarachnoid hemorrhage) (HCC) 02/20/2017    02/22/2017, PTA 12/16/2020, 8:41 AM  [facility] [place] [facility] [address] [place], [place], [place] Phone: [phone]  Fax:  [phone]  Name: Brad Velez MRN: [id number] Date of Birth: 1951/05/28

## 2020-12-21 ENCOUNTER — Ambulatory Visit: Payer: Medicare Other | Admitting: Physical Therapy

## 2020-12-21 ENCOUNTER — Encounter: Payer: Self-pay | Admitting: Physical Therapy

## 2020-12-21 ENCOUNTER — Other Ambulatory Visit: Payer: Self-pay

## 2020-12-21 DIAGNOSIS — M25511 Pain in right shoulder: Secondary | ICD-10-CM

## 2020-12-21 DIAGNOSIS — M25611 Stiffness of right shoulder, not elsewhere classified: Secondary | ICD-10-CM

## 2020-12-21 DIAGNOSIS — M6281 Muscle weakness (generalized): Secondary | ICD-10-CM

## 2020-12-21 NOTE — Patient Instructions (Signed)
Access Code: [email or web address] URL: [email or web address] Date: 12/21/2020 Prepared by: Lysle Rubens  Exercises Standing Shoulder Internal Rotation Stretch with Towel - 1 x daily - 7 x weekly - 3 sets - 10 reps - 5 sec hold Standing Shoulder Internal Rotation Stretch Behind Back - 1 x daily - 7 x weekly - 3 sets - 3 reps - 10-15 sec hold

## 2020-12-21 NOTE — Therapy (Signed)
Brad Velez. [place], [place], [address] Phone: [phone]   Fax:  [phone]  Physical Therapy Treatment  Patient Details  Name: Brad Velez MRN: [id number] Date of Birth: 1951-09-11 Referring Provider (PT): Brad Velez   Encounter Date: 12/21/2020   PT End of Session - 12/21/20 0920    Visit Number 4    Date for PT Re-Evaluation 02/06/21    PT Start Time 0837    PT Stop Time 0918    PT Time Calculation (min) 41 min    Activity Tolerance Patient tolerated treatment well    Behavior During Therapy [facility] for tasks assessed/performed           Past Medical History:  Diagnosis Date  . H/O hiatal hernia   . Hyperlipidemia 02/23/2017  . Hypertension    hx of but off of medication for 5 yrs  . Obesity (BMI 30-39.9) 02/23/2017  . Pneumonia    1- 2 yrs ago  . Premature ventricular contractions (PVCs) (VPCs) 02/23/2017  . SAH (subarachnoid hemorrhage) ([place]) 02/20/2017  . Sleep apnea    doesnt use CPAP- tested 15 yrs ago    Past Surgical History:  Procedure Laterality Date  . COLONOSCOPY    . IR ANGIO EXTERNAL CAROTID SEL EXT CAROTID BILAT MOD SED  02/20/2017  . IR ANGIO INTRA EXTRACRAN SEL INTERNAL CAROTID BILAT MOD SED  02/20/2017  . IR ANGIO INTRA EXTRACRAN SEL INTERNAL CAROTID BILAT MOD SED  02/27/2017  . IR ANGIO VERTEBRAL SEL VERTEBRAL BILAT MOD SED  02/20/2017  . IR ANGIO VERTEBRAL SEL VERTEBRAL BILAT MOD SED  02/27/2017  . IR ANGIOGRAM SELECTIVE EACH ADDITIONAL VESSEL  02/20/2017  . KNEE ARTHROSCOPY     right  . TONSILLECTOMY    . TOTAL KNEE ARTHROPLASTY  04/30/2012   Procedure: TOTAL KNEE ARTHROPLASTY;  Surgeon: Brad Haskell, MD;  Location: [facility];  Service: Orthopedics;  Laterality: Right;  . UPPER GASTROINTESTINAL ENDOSCOPY    . VASECTOMY      There were no vitals filed for this visit.   Subjective Assessment - 12/21/20 0838    Subjective Fantastic, getting better, still difficult reaching behind back     Currently in Pain? Yes    Pain Score 1     Pain Location Shoulder    Pain Orientation Right                             [facility] Adult PT Treatment/Exercise - 12/21/20 0001      Shoulder Exercises: Standing   Horizontal ABduction Strengthening;Both;Theraband;20 reps    Theraband Level (Shoulder Horizontal ABduction) Level 3 (Green)    External Rotation Strengthening;Right;Theraband;20 reps    Theraband Level (Shoulder External Rotation) Level 3 (Green)    Internal Rotation Theraband;20 reps;Right;Strengthening    Theraband Level (Shoulder Internal Rotation) Level 3 (Green)    Flexion Weights;20 reps;Both;Strengthening    Shoulder Flexion Weight (lbs) 3    ABduction 20 reps;Weights;Both;Strengthening    Shoulder ABduction Weight (lbs) 3    Extension 20 reps;Both;Strengthening;Weights    Extension Weight (lbs) 10    Other Standing Exercises tricep ext 35 lb, bicep curls 20 2x10    Other Standing Exercises AAROM flex/IR wth 2# weight bar, IR/ER stretch with towel, red scap stab 3 ways      Shoulder Exercises: ROM/Strengthening   UBE (Upper Arm Bike) L1.6 x3 min each      Shoulder Exercises:  Power [employer] Rows 25lbs & Lats 25lb 2x10    Other Power Tower Exercises shoulder ext 10# 2x10; chest press 15# 2x10                    PT Short Term Goals - 12/14/20 0841      PT SHORT TERM GOAL #1   Title Pt will be I with initial HEP    Status Achieved             PT Long Term Goals - 12/21/20 3710      PT LONG TERM GOAL #1   Title Pt will be I with advanced HEP    Time 8    Period Weeks    Status Partially Met      PT LONG TERM GOAL #2   Title Pt will demo R shoulder AROM equivalent to L shoulder    Time 8    Period Weeks    Status On-going      PT LONG TERM GOAL #3   Title Pt will demo able to lift up to 25 lbs with no increase in R shoulder pain    Time 8    Period Weeks    Status On-going      PT LONG TERM  GOAL #4   Title Pt will report able to perform all ADLs including reaching overhead with no increase in R shoulder pain    Time 8    Period Weeks    Status Partially Met      PT LONG TERM GOAL #5   Title Pt will demo R shoulder IR/ER MMT 5/5    Baseline 4/5    Time 8    Period Weeks    Status On-going                 Plan - 12/21/20 0920    Clinical Impression Statement Pt doing well with progression of machine interventions; instructed that he could begin incorporating machine exercises into gym routine, emphasized slow progression of weights with pt VU. Pt does demo weakness/pain with standing abduction. Tightness with RUE IR; added IR stretching to HEP.    PT Treatment/Interventions ADLs/Self Care Home Management;Electrical Stimulation;Iontophoresis 36m/ml Dexamethasone;Moist Heat;Therapeutic activities;Therapeutic exercise;Manual techniques;Patient/family education;Passive range of motion;Dry needling;Vasopneumatic Device;Taping    PT Next Visit Plan ROM, progression of resistance ex's    PT Home Exercise Plan added IR stretching    Consulted and Agree with Plan of Care Patient           Patient will benefit from skilled therapeutic intervention in order to improve the following deficits and impairments:  Decreased range of motion,Increased muscle spasms,Impaired UE functional use,Pain,Hypomobility,Decreased strength,Postural dysfunction  Visit Diagnosis: Stiffness of right shoulder, not elsewhere classified  Muscle weakness (generalized)  Acute pain of right shoulder     Problem List Patient Active Problem List   Diagnosis Date Noted  . Obesity (BMI 30-39.9) 02/23/2017  . Hypertensive heart disease 02/23/2017  . Sleep apnea 02/23/2017  . Premature ventricular contractions (PVCs) (VPCs) 02/23/2017  . Hyperlipidemia 02/23/2017  . [place] 02/[place]   Brad Velez PT, DPT ADonald ProseSugg 12/21/2020, 9:23 AM  [place] [address]Phone: [phone]  Fax:  [phone] Name: Brad NEECEMRN: [id number] of Birth: [birth year]

## 2020-12-23 ENCOUNTER — Encounter: Payer: Self-pay | Admitting: Physical Therapy

## 2020-12-23 ENCOUNTER — Ambulatory Visit: Payer: Medicare Other | Admitting: Physical Therapy

## 2020-12-23 ENCOUNTER — Other Ambulatory Visit: Payer: Self-pay

## 2020-12-23 DIAGNOSIS — M25511 Pain in right shoulder: Secondary | ICD-10-CM

## 2020-12-23 DIAGNOSIS — M6281 Muscle weakness (generalized): Secondary | ICD-10-CM

## 2020-12-23 DIAGNOSIS — M25611 Stiffness of right shoulder, not elsewhere classified: Secondary | ICD-10-CM

## 2020-12-23 NOTE — Therapy (Signed)
[facility]. [place], [place], [address] Phone: [phone]   Fax:  [phone]  Physical Therapy Treatment  Patient Details  Name: Brad Velez MRN: [id number] Date of Birth: [birth year] Referring Provider (PT): [place]   Encounter Date: 12/23/2020   PT End of Session - 12/23/20 0835    Visit Number 5    Date for PT Re-Evaluation 02/06/21    PT Start Time 0800    PT Stop Time 0835    PT Time Calculation (min) 35 min    Activity Tolerance Patient tolerated treatment well    Behavior During Therapy [facility] for tasks assessed/performed           Past Medical History:  Diagnosis Date  . H/O hiatal hernia   . Hyperlipidemia 02/23/2017  . Hypertension    hx of but off of medication for 5 yrs  . Obesity (BMI 30-39.9) 02/23/2017  . Pneumonia    1- 2 yrs ago  . Premature ventricular contractions (PVCs) (VPCs) 02/23/2017  . SAH (subarachnoid hemorrhage) ([place]) 02/20/2017  . Sleep apnea    doesnt use CPAP- tested 15 yrs ago    Past Surgical History:  Procedure Laterality Date  . COLONOSCOPY    . IR ANGIO EXTERNAL CAROTID SEL EXT CAROTID BILAT MOD SED  02/20/2017  . IR ANGIO INTRA EXTRACRAN SEL INTERNAL CAROTID BILAT MOD SED  02/20/2017  . IR ANGIO INTRA EXTRACRAN SEL INTERNAL CAROTID BILAT MOD SED  02/27/2017  . IR ANGIO VERTEBRAL SEL VERTEBRAL BILAT MOD SED  02/20/2017  . IR ANGIO VERTEBRAL SEL VERTEBRAL BILAT MOD SED  02/27/2017  . IR ANGIOGRAM SELECTIVE EACH ADDITIONAL VESSEL  02/20/2017  . KNEE ARTHROSCOPY     right  . TONSILLECTOMY    . TOTAL KNEE ARTHROPLASTY  04/30/2012   Procedure: TOTAL KNEE ARTHROPLASTY;  Surgeon: Rudean Haskell, MD;  Location: [facility];  Service: Orthopedics;  Laterality: Right;  . UPPER GASTROINTESTINAL ENDOSCOPY    . VASECTOMY      There were no vitals filed for this visit.   Subjective Assessment - 12/23/20 0801    Subjective Getting alone pretty good    Currently in Pain? No/denies                              [facility] Adult PT Treatment/Exercise - 12/23/20 0001      Shoulder Exercises: Standing   Horizontal ABduction Strengthening;Both;Theraband;20 reps    Theraband Level (Shoulder Horizontal ABduction) Level 3 (Green)    External Rotation Strengthening;Right;Theraband;15 reps   x2   Theraband Level (Shoulder External Rotation) Level 3 (Green)    Internal Rotation Strengthening;Right;20 reps;Weights    Internal Rotation Weight (lbs) 10    Flexion Weights;20 reps;Both;Strengthening    Shoulder Flexion Weight (lbs) 3    ABduction 20 reps;Weights;Both;Strengthening    Shoulder ABduction Weight (lbs) 3    Other Standing Exercises RUE ER yellow abd 90 2x10    Other Standing Exercises 4lb OHP 2x10; Tricep Ext 45lb 3x10      Shoulder Exercises: ROM/Strengthening   UBE (Upper Arm Bike) L2.2 x3 min each      Shoulder Exercises: Power [employer]   Other Power [employer] Exercises Rows 35lbs & Lats 35lb 2x10    Other Power Tower Exercises shoulder ext 10# 2x10; chest press 15# 3x10  PT Short Term Goals - 12/14/20 0841      PT SHORT TERM GOAL #1   Title Pt will be I with initial HEP    Status Achieved             PT Long Term Goals - 12/23/20 0835      PT LONG TERM GOAL #1   Title Pt will be I with advanced HEP    Status Achieved      PT LONG TERM GOAL #2   Title Pt will demo R shoulder AROM equivalent to L shoulder    Status Partially Met      PT LONG TERM GOAL #3   Title Pt will demo able to lift up to 25 lbs with no increase in R shoulder pain    Status Partially Met      PT LONG TERM GOAL #4   Title Pt will report able to perform all ADLs including reaching overhead with no increase in R shoulder pain    Status Achieved      PT LONG TERM GOAL #5   Title Pt will demo R shoulder IR/ER MMT 5/5    Status On-going                 Plan - 12/23/20 6270    Clinical Impression Statement PT did well overall with  today's interventions he continues to have some RUE ROM and strength limitations. Overall he reports that's he feel fine and is pleased with his current functional status. Pt stated that [facility] feel [facility]    Comorbidities hiatial hernia, HTN, hx of subarachnoid hemorrhage (2018)    Examination-Activity Limitations Lift;Reach Overhead;Carry    Examination-Participation Restrictions Occupation;Community Activity;Interpersonal Relationship    Stability/Clinical Decision Making Evolving/Moderate complexity    Rehab Potential Good    PT Frequency 2x / week    PT Duration 6 weeks    PT Treatment/Interventions ADLs/Self Care Home Management;Electrical Stimulation;Iontophoresis 63m/ml Dexamethasone;Moist Heat;Therapeutic activities;Therapeutic exercise;Manual techniques;Patient/family education;Passive range of motion;Dry needling;Vasopneumatic Device;Taping    PT Next Visit Plan D/C PT           Patient will benefit from skilled therapeutic intervention in order to improve the following deficits and impairments:  Decreased range of motion,Increased muscle spasms,Impaired UE functional use,Pain,Hypomobility,Decreased strength,Postural dysfunction  Visit Diagnosis: Muscle weakness (generalized)  Stiffness of right shoulder, not elsewhere classified  Acute pain of right shoulder     Problem List Patient Active Problem List   Diagnosis Date Noted  . Obesity (BMI 30-39.9) 02/23/2017  . Hypertensive heart disease 02/23/2017  . Sleep apnea 02/23/2017  . Premature ventricular contractions (PVCs) (VPCs) 02/23/2017  . Hyperlipidemia 02/23/2017  . [place] 02[place]   PHYSICAL THERAPY DISCHARGE SUMMARY  Visits from Start of Care: 5 Plan: Patient agrees to discharge.  Patient goals were partially met. Patient is being discharged due to being pleased with the current functional level.  ?????    AAmador Cunas PT, DPT RScot Jun PTA 12/23/2020, 8:38 AM  [place] [address]Phone: [phone]  Fax:  [phone] Name: Brad ARWOODMRN: [id number] of Birth: 1September 19, 1952

## 2020-12-28 ENCOUNTER — Ambulatory Visit: Payer: Medicare Other | Admitting: Physical Therapy

## 2020-12-30 ENCOUNTER — Ambulatory Visit: Payer: Medicare Other | Admitting: Physical Therapy

## 2021-12-08 DEATH — deceased

## 2023-01-04 ENCOUNTER — Encounter: Payer: Self-pay | Admitting: Gastroenterology
